# Patient Record
Sex: Male | Born: 1955 | Race: White | Hispanic: Yes | Smoking: Never smoker
Health system: Southern US, Community
[De-identification: ages and names within clinical notes are randomized; demographics above are authoritative.]

## PROBLEM LIST (undated history)

## (undated) DIAGNOSIS — G8929 Other chronic pain: Secondary | ICD-10-CM

## (undated) DIAGNOSIS — N529 Male erectile dysfunction, unspecified: Secondary | ICD-10-CM

## (undated) DIAGNOSIS — R399 Unspecified symptoms and signs involving the genitourinary system: Secondary | ICD-10-CM

## (undated) DIAGNOSIS — E039 Hypothyroidism, unspecified: Secondary | ICD-10-CM

## (undated) DIAGNOSIS — B351 Tinea unguium: Secondary | ICD-10-CM

## (undated) DIAGNOSIS — K219 Gastro-esophageal reflux disease without esophagitis: Secondary | ICD-10-CM

## (undated) DIAGNOSIS — E785 Hyperlipidemia, unspecified: Secondary | ICD-10-CM

## (undated) DIAGNOSIS — M549 Dorsalgia, unspecified: Secondary | ICD-10-CM

## (undated) DIAGNOSIS — M25519 Pain in unspecified shoulder: Secondary | ICD-10-CM

## (undated) DIAGNOSIS — L309 Dermatitis, unspecified: Secondary | ICD-10-CM

## (undated) DIAGNOSIS — M542 Cervicalgia: Secondary | ICD-10-CM

## (undated) DIAGNOSIS — E119 Type 2 diabetes mellitus without complications: Secondary | ICD-10-CM

## (undated) HISTORY — DX: Type 2 diabetes mellitus without complications: E11.9

## (undated) HISTORY — DX: Other chronic pain: G89.29

## (undated) HISTORY — DX: Male erectile dysfunction, unspecified: N52.9

## (undated) HISTORY — DX: Dorsalgia, unspecified: M54.9

## (undated) HISTORY — DX: Gastro-esophageal reflux disease without esophagitis: K21.9

## (undated) HISTORY — DX: Pain in unspecified shoulder: M25.519

## (undated) HISTORY — DX: Unspecified symptoms and signs involving the genitourinary system: R39.9

## (undated) HISTORY — DX: Hypothyroidism, unspecified: E03.9

## (undated) HISTORY — DX: Hyperlipidemia, unspecified: E78.5

## (undated) HISTORY — DX: Tinea unguium: B35.1

## (undated) HISTORY — DX: Dermatitis, unspecified: L30.9

## (undated) HISTORY — DX: Cervicalgia: M54.2

---

## 1997-07-08 DIAGNOSIS — Z5189 Encounter for other specified aftercare: Secondary | ICD-10-CM

## 1997-07-08 HISTORY — PX: CHOLECYSTECTOMY, LAPAROSCOPIC: SHX56

## 1997-07-08 HISTORY — DX: Encounter for other specified aftercare: Z51.89

## 1998-07-08 HISTORY — PX: HERNIA REPAIR: SHX51

## 2003-07-09 HISTORY — PX: THYROID SURGERY: SHX805

## 2005-07-08 HISTORY — PX: SHOULDER SURGERY: SHX246

## 2005-07-08 HISTORY — PX: NECK SURGERY: SHX720

## 2006-10-22 ENCOUNTER — Ambulatory Visit: Payer: Self-pay | Admitting: Family Medicine

## 2006-10-23 ENCOUNTER — Ambulatory Visit: Payer: Self-pay | Admitting: *Deleted

## 2006-12-04 ENCOUNTER — Ambulatory Visit: Payer: Self-pay | Admitting: Internal Medicine

## 2006-12-22 ENCOUNTER — Ambulatory Visit: Payer: Self-pay | Admitting: Family Medicine

## 2007-01-01 ENCOUNTER — Ambulatory Visit: Payer: Self-pay | Admitting: Internal Medicine

## 2007-03-16 ENCOUNTER — Encounter (INDEPENDENT_AMBULATORY_CARE_PROVIDER_SITE_OTHER): Payer: Self-pay | Admitting: Family Medicine

## 2007-03-16 ENCOUNTER — Ambulatory Visit: Payer: Self-pay | Admitting: Internal Medicine

## 2007-03-16 LAB — CONVERTED CEMR LAB
HDL: 43 mg/dL (ref >39)
LDL Cholesterol: 148 mg/dL — ABNORMAL HIGH (ref 0–99)
Total CHOL/HDL Ratio: 5.3
Triglycerides: 189 mg/dL — ABNORMAL HIGH (ref <150)

## 2007-03-30 ENCOUNTER — Ambulatory Visit: Payer: Self-pay | Admitting: Internal Medicine

## 2007-03-30 LAB — CONVERTED CEMR LAB
BUN: 20 mg/dL (ref 6–23)
CO2: 21 meq/L (ref 19–32)
Creatinine, Ser: 0.73 mg/dL (ref 0.40–1.50)
Glucose, Bld: 112 mg/dL — ABNORMAL HIGH (ref 70–99)

## 2007-05-04 ENCOUNTER — Ambulatory Visit: Payer: Self-pay | Admitting: Internal Medicine

## 2007-05-04 LAB — CONVERTED CEMR LAB
Cholesterol: 229 mg/dL — ABNORMAL HIGH (ref 0–200)
HDL: 39 mg/dL — ABNORMAL LOW (ref >39)
Total CHOL/HDL Ratio: 5.9

## 2007-05-20 ENCOUNTER — Ambulatory Visit: Payer: Self-pay | Admitting: Internal Medicine

## 2007-07-20 ENCOUNTER — Ambulatory Visit: Payer: Self-pay | Admitting: Internal Medicine

## 2007-07-20 LAB — CONVERTED CEMR LAB
Albumin: 4.7 g/dL (ref 3.5–5.2)
Alkaline Phosphatase: 109 units/L (ref 39–117)
CO2: 22 meq/L (ref 19–32)
Calcium: 9.3 mg/dL (ref 8.4–10.5)
Creatinine, Ser: 0.75 mg/dL (ref 0.40–1.50)
LDL Cholesterol: 98 mg/dL (ref 0–99)
Potassium: 4.3 meq/L (ref 3.5–5.3)
Total Bilirubin: 0.5 mg/dL (ref 0.3–1.2)
Total Protein: 7.4 g/dL (ref 6.0–8.3)

## 2007-07-27 ENCOUNTER — Ambulatory Visit (HOSPITAL_COMMUNITY): Admission: RE | Admit: 2007-07-27 | Discharge: 2007-07-27 | Payer: Self-pay | Admitting: Family Medicine

## 2007-07-27 ENCOUNTER — Ambulatory Visit: Payer: Self-pay | Admitting: Internal Medicine

## 2007-09-25 ENCOUNTER — Ambulatory Visit: Payer: Self-pay | Admitting: Internal Medicine

## 2007-12-25 ENCOUNTER — Ambulatory Visit: Payer: Self-pay | Admitting: Family Medicine

## 2007-12-25 LAB — CONVERTED CEMR LAB
Free Thyroxine Index: 3.8 (ref 1.0–3.9)
PSA: 0.44 ng/mL (ref 0.10–4.00)
Sed Rate: 2 mm/hr (ref 0–16)
T3 Uptake Ratio: 37.6 % — ABNORMAL HIGH (ref 22.5–37.0)
T4, Total: 10.1 ug/dL (ref 5.0–12.5)
TSH: 0.601 microintl units/mL (ref 0.350–5.50)

## 2008-05-06 ENCOUNTER — Ambulatory Visit: Payer: Self-pay | Admitting: Internal Medicine

## 2008-05-13 ENCOUNTER — Ambulatory Visit: Payer: Self-pay | Admitting: Internal Medicine

## 2008-05-13 ENCOUNTER — Encounter: Payer: Self-pay | Admitting: Family Medicine

## 2008-05-13 LAB — CONVERTED CEMR LAB
ALT: 31 units/L (ref 0–53)
AST: 20 units/L (ref 0–37)
Albumin: 4.4 g/dL (ref 3.5–5.2)
Alkaline Phosphatase: 108 units/L (ref 39–117)
BUN: 17 mg/dL (ref 6–23)
Basophils Absolute: 0 10*3/uL (ref 0.0–0.1)
Calcium: 9.2 mg/dL (ref 8.4–10.5)
Chloride: 104 meq/L (ref 96–112)
Cholesterol: 155 mg/dL (ref 0–200)
Creatinine, Ser: 0.75 mg/dL (ref 0.40–1.50)
Glucose, Bld: 104 mg/dL — ABNORMAL HIGH (ref 70–99)
HCT: 46.3 % (ref 39.0–52.0)
HDL: 40 mg/dL (ref >39)
Hemoglobin: 15.4 g/dL (ref 13.0–17.0)
Lymphs Abs: 1.7 10*3/uL (ref 0.7–4.0)
Potassium: 4.5 meq/L (ref 3.5–5.3)
RBC: 5.14 M/uL (ref 4.22–5.81)
RDW: 13.3 % (ref 11.5–15.5)
TSH: 1.294 microintl units/mL (ref 0.350–4.50)
Total Bilirubin: 0.8 mg/dL (ref 0.3–1.2)
WBC: 5.7 10*3/uL (ref 4.0–10.5)

## 2008-07-22 ENCOUNTER — Ambulatory Visit: Payer: Self-pay | Admitting: Family Medicine

## 2009-03-22 ENCOUNTER — Ambulatory Visit: Payer: Self-pay | Admitting: Internal Medicine

## 2009-03-22 ENCOUNTER — Encounter: Payer: Self-pay | Admitting: Family Medicine

## 2009-03-22 LAB — CONVERTED CEMR LAB
ALT: 41 units/L (ref 0–53)
AST: 26 units/L (ref 0–37)
Albumin: 4.7 g/dL (ref 3.5–5.2)
Alkaline Phosphatase: 112 units/L (ref 39–117)
Basophils Relative: 0 % (ref 0–1)
Calcium: 9.5 mg/dL (ref 8.4–10.5)
Chloride: 106 meq/L (ref 96–112)
Eosinophils Absolute: 0.2 10*3/uL (ref 0.0–0.7)
Eosinophils Relative: 2 % (ref 0–5)
Glucose, Bld: 94 mg/dL (ref 70–99)
MCHC: 35.2 g/dL (ref 30.0–36.0)
Monocytes Relative: 9 % (ref 3–12)
Neutro Abs: 5.8 10*3/uL (ref 1.7–7.7)
Neutrophils Relative %: 62 % (ref 43–77)
Sodium: 139 meq/L (ref 135–145)
TSH: 2.154 microintl units/mL (ref 0.350–4.500)
Total Bilirubin: 0.4 mg/dL (ref 0.3–1.2)
Triglycerides: 161 mg/dL — ABNORMAL HIGH (ref <150)
VLDL: 32 mg/dL (ref 0–40)

## 2009-05-01 ENCOUNTER — Encounter: Payer: Self-pay | Admitting: Family Medicine

## 2009-05-01 ENCOUNTER — Ambulatory Visit: Payer: Self-pay | Admitting: Internal Medicine

## 2009-09-08 ENCOUNTER — Ambulatory Visit: Payer: Self-pay | Admitting: Internal Medicine

## 2009-09-08 ENCOUNTER — Ambulatory Visit (HOSPITAL_COMMUNITY): Admission: RE | Admit: 2009-09-08 | Discharge: 2009-09-08 | Payer: Self-pay | Admitting: Internal Medicine

## 2009-09-08 LAB — CONVERTED CEMR LAB
CO2: 25 meq/L (ref 19–32)
Calcium: 9 mg/dL (ref 8.4–10.5)
Chloride: 103 meq/L (ref 96–112)
Glucose, Bld: 93 mg/dL (ref 70–99)
HDL: 38 mg/dL — ABNORMAL LOW (ref >39)
LDL Cholesterol: 84 mg/dL (ref 0–99)

## 2009-09-15 ENCOUNTER — Ambulatory Visit: Payer: Self-pay | Admitting: Internal Medicine

## 2010-06-15 ENCOUNTER — Encounter (INDEPENDENT_AMBULATORY_CARE_PROVIDER_SITE_OTHER): Payer: Self-pay | Admitting: *Deleted

## 2010-06-15 LAB — CONVERTED CEMR LAB
TSH: 10.17 microintl units/mL — ABNORMAL HIGH (ref 0.350–4.500)
Total CHOL/HDL Ratio: 5
VLDL: 38 mg/dL (ref 0–40)

## 2010-08-06 ENCOUNTER — Encounter (INDEPENDENT_AMBULATORY_CARE_PROVIDER_SITE_OTHER): Payer: Self-pay | Admitting: *Deleted

## 2010-08-06 LAB — CONVERTED CEMR LAB
AST: 23 units/L (ref 0–37)
Albumin: 4.6 g/dL (ref 3.5–5.2)
BUN: 19 mg/dL (ref 6–23)
Basophils Relative: 1 % (ref 0–1)
CO2: 23 meq/L (ref 19–32)
Chloride: 105 meq/L (ref 96–112)
Cholesterol: 194 mg/dL (ref 0–200)
Creatinine, Ser: 0.71 mg/dL (ref 0.40–1.50)
Glucose, Bld: 104 mg/dL — ABNORMAL HIGH (ref 70–99)
HCT: 45.4 % (ref 39.0–52.0)
Lymphocytes Relative: 35 % (ref 12–46)
Lymphs Abs: 2.2 10*3/uL (ref 0.7–4.0)
Monocytes Absolute: 0.6 10*3/uL (ref 0.1–1.0)
Monocytes Relative: 10 % (ref 3–12)
Neutro Abs: 3.4 10*3/uL (ref 1.7–7.7)
Neutrophils Relative %: 53 % (ref 43–77)
Potassium: 4.1 meq/L (ref 3.5–5.3)
RBC: 5.1 M/uL (ref 4.22–5.81)
Sodium: 138 meq/L (ref 135–145)
Total Bilirubin: 0.5 mg/dL (ref 0.3–1.2)

## 2010-09-20 ENCOUNTER — Emergency Department (HOSPITAL_BASED_OUTPATIENT_CLINIC_OR_DEPARTMENT_OTHER): Payer: Worker's Compensation | Attending: Emergency Medicine

## 2010-09-20 ENCOUNTER — Emergency Department (HOSPITAL_BASED_OUTPATIENT_CLINIC_OR_DEPARTMENT_OTHER)
Admission: EM | Admit: 2010-09-20 | Discharge: 2010-09-20 | Disposition: A | Discharge status: Home / Self Care | Payer: Worker's Compensation | Source: Home / Self Care | Attending: Emergency Medicine | Admitting: Emergency Medicine

## 2010-09-20 ENCOUNTER — Emergency Department (INDEPENDENT_AMBULATORY_CARE_PROVIDER_SITE_OTHER): Payer: Worker's Compensation

## 2010-09-20 DIAGNOSIS — W1789XA Other fall from one level to another, initial encounter: Secondary | ICD-10-CM

## 2010-09-20 DIAGNOSIS — M79609 Pain in unspecified limb: Secondary | ICD-10-CM

## 2010-09-20 DIAGNOSIS — M25579 Pain in unspecified ankle and joints of unspecified foot: Secondary | ICD-10-CM

## 2010-09-20 DIAGNOSIS — S93409A Sprain of unspecified ligament of unspecified ankle, initial encounter: Secondary | ICD-10-CM | POA: Insufficient documentation

## 2010-09-24 ENCOUNTER — Other Ambulatory Visit: Payer: Self-pay | Admitting: Family Medicine

## 2010-09-24 ENCOUNTER — Encounter: Payer: Self-pay | Admitting: Family Medicine

## 2010-09-24 ENCOUNTER — Ambulatory Visit (INDEPENDENT_AMBULATORY_CARE_PROVIDER_SITE_OTHER): Payer: Worker's Compensation | Admitting: Family Medicine

## 2010-09-24 ENCOUNTER — Ambulatory Visit (HOSPITAL_BASED_OUTPATIENT_CLINIC_OR_DEPARTMENT_OTHER)
Admission: RE | Admit: 2010-09-24 | Discharge: 2010-09-24 | Disposition: A | Discharge status: Home / Self Care | Payer: Worker's Compensation | Source: Ambulatory Visit | Attending: Family Medicine | Admitting: Family Medicine

## 2010-09-24 DIAGNOSIS — I10 Essential (primary) hypertension: Secondary | ICD-10-CM | POA: Insufficient documentation

## 2010-09-24 DIAGNOSIS — M79609 Pain in unspecified limb: Secondary | ICD-10-CM

## 2010-09-24 DIAGNOSIS — M25579 Pain in unspecified ankle and joints of unspecified foot: Secondary | ICD-10-CM

## 2010-09-24 DIAGNOSIS — E039 Hypothyroidism, unspecified: Secondary | ICD-10-CM | POA: Insufficient documentation

## 2010-09-24 DIAGNOSIS — M25539 Pain in unspecified wrist: Secondary | ICD-10-CM | POA: Insufficient documentation

## 2010-09-24 DIAGNOSIS — M25549 Pain in joints of unspecified hand: Secondary | ICD-10-CM | POA: Insufficient documentation

## 2010-10-04 NOTE — Assessment & Plan Note (Signed)
Summary: LEFT HAND AND RT FOOT PAIN/ACCIDENT/NP/LP   Vital Signs:  Patient profile:   55 year old male Height:      66 inches (167.64 cm) Weight:      190 pounds (86.36 kg) BMI:     30.78 Temp:     98.0 degrees F (36.67 degrees C) oral Pulse rate:   76 / minute BP sitting:   146 / 90  (right arm)  Vitals Entered By: Baxter Hire) (September 24, 2010 11:07 AM) CC: L Hand and R foot pain due to accident Nutritional Status BMI of > 30 = obese  Does patient need assistance? Functional Status Self care Ambulation Normal   CC:  L Hand and R foot pain due to accident.  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 Unsure if he rolled his ankle when this happened. + Swelling, bruising, unable to walk following injury to right leg. No prior right leg fractures or severe sprains. He went to ED following injury and had x-rays of tib-fib, ankle and foot which were all negative. His wrist did not start hurting him until later in the day and the next day. He bought an over the counter wrist brace which helps but noted he is wearing a right splint on his left wrist. No swelling or bruising of wrist - pain is centered about distal wrist, 1st and 2nd metacarpals. Taking hydrocodone and icing which have helped both conditions Was also given an ASO which he has been wearing on right ankle and using crutches Has been out of work since injury.  Habits & Providers  Alcohol-Tobacco-Diet     Alcohol drinks/day: 0     Tobacco Status: never  Current Medications (verified): 1)  Levothroid 100 Mcg Tabs (Levothyroxine Sodium)  Allergies (verified): 1)  !  Penicillin  Past History:  Past Surgical History: Left shoulder surgery - ? rotator cuff - reports  ~20 pins/screws in shoulder  Family History: + HTN parents negative heart disease, DM  Social History: negative tobacco and alcohol use Works for [employer] Status:  never  Physical Exam  General:  Well-developed,well-nourished,in no acute distress; alert,appropriate and cooperative throughout examination Msk:  L wrist: No gross deformity, swelling, or bruising. Mild TTP 1st and 2nd MC.  No snuffbox tenderness.  Mild tenderness at radioulnar joint as well Lacks 15 degrees flexion and extension of wrist. No pain with ulnar deviation. Finger abduction, thumb opposition strength 5/5.  FROM fingers NVI distally.  R lower leg: Mod swelling and some bruising throughout lower leg (bruising mostly centered about medial heel. TTP diffusely about achilles, post/med malleoli, ATFL, ankle joint.  No base 5th or navicular tenderness or fibular head tenderness of foot/lower leg. Very limited ROM of ankle. Did not assess strength. Achilles feels intact - confirmed with brief u/s evaluation.  Negative thompsons test 1+ ant drawer and talar tilt - painful. < 2 sec cap refill, 2+ dp pulses, sensation intact   Impression & Recommendations:  Problem # 1:  ANKLE PAIN, RIGHT (ICD-719.47) Assessment New 2/2 ankle sprain and severe contusions from his fall.  Continue ASO.  Ice area  15 minutes at a time 3-4 times a day, crutches, elevation.  Aleve twice a day to help with pain, swelling as well.  Bear weight when tolerated.  Simple ROM/alphabet exercises.  F/u in 2 weeks for recheck.  Out of work in the meantime.  Problem # 2:  WRIST PAIN, LEFT (ZOX-096.04) Assessment: New X-rays negative for acute fracture to wrist or hand.  No tenderness at snuffbox so no concern for scaphoid fracture that would require thumb spica.  Provided with wrist brace for left side today.  Icing, elevation, aleve will  help with this as well.  In 2 weeks anticipate starting exercises.  Will consider PT for this and ankle.  Orders: Diagnostic X-Ray/Fluoroscopy (Diagnostic X-Ray/Flu) Brace Wrist ([id number])  Complete Medication List: 1)  Levothroid 100 Mcg Tabs (Levothyroxine sodium)  Patient Instructions: 1)  Ice 15 minutes at a time 3-4 times a day 2)  Take aleve 2 tablets twice a day with food for pain and inflammation. 3)  Elevate above the level of your heart when possible 4)  Crutches if needed to help with walking 5)  Bear weight when tolerated 6)  Up/down and alphabet exercise 2-3 sets once a day. 7)  For wrist, wear wrist brace. 8)  Icing, aleve will help with your wrist as well.  9)  Out of work in the meantime 10)  Follow up with me in 2 weeks for a recheck on your status. 11)  We will consider physical therapy in the future.   Orders Added: 1)  Diagnostic X-Ray/Fluoroscopy [Diagnostic X-Ray/Flu] 2)  Diagnostic X-Ray/Fluoroscopy [Diagnostic X-Ray/Flu] 3)  New Patient Level III [11914] 4)  Brace Wrist [[id number]]

## 2010-10-04 NOTE — Letter (Signed)
Summary: Generic Letter  [facility]  [address]   [place], [place] [address]   Phone: [phone]  Fax:     09/24/2010  Nicholas Barton [address] [place], [place]  [address]  [place]  To Whom It May Concern:  Nicholas Barton will be out of work for at least the next two weeks.  His injuries may require up to 6 weeks to completely recover from.  We will reassess his status at an appointment in 2 weeks.   Sincerely,   Norton Blizzard MD

## 2010-10-08 ENCOUNTER — Ambulatory Visit (INDEPENDENT_AMBULATORY_CARE_PROVIDER_SITE_OTHER): Payer: Worker's Compensation | Admitting: Family Medicine

## 2010-10-08 ENCOUNTER — Encounter: Payer: Self-pay | Admitting: Family Medicine

## 2010-10-08 DIAGNOSIS — M25539 Pain in unspecified wrist: Secondary | ICD-10-CM

## 2010-10-08 DIAGNOSIS — M79604 Pain in right leg: Secondary | ICD-10-CM

## 2010-10-08 DIAGNOSIS — M79609 Pain in unspecified limb: Secondary | ICD-10-CM

## 2010-10-08 DIAGNOSIS — M25579 Pain in unspecified ankle and joints of unspecified foot: Secondary | ICD-10-CM

## 2010-10-08 NOTE — Assessment & Plan Note (Signed)
Concern given now 2 1/2 weeks out, still having difficulty bearing weight with persistent swelling and pain distal tibia.  X-rays negative for fracture - will proceed with MRI of tibia/fibula to further assess for bony and soft tissue injury.  Would expect improvement since visit 2 weeks ago.  Continue icing, aleve, crutches, ASO in meantime.

## 2010-10-08 NOTE — Patient Instructions (Signed)
Given that you are not improving and the x-rays were negative, we will go ahead with MRIs to further assess for fracture or ligamentous injury that may have been missed or not seen on the x-rays. Continue with the braces for your wrist and ankle. Crutches to help with walking. Ice as needed We will contact you with the date and time of the studies - I will call you 1-2 days after these to tell you how we are to proceed.

## 2010-10-08 NOTE — Progress Notes (Signed)
Subjective:    Patient ID: Nicholas Barton, male    DOB: 03-Jul-1956, 55 y.o.   MRN: [id number]  HPI 55 yo M here for 2 1/2 week f/u left wrist and lower leg pain s/p accident at work.  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  Unsure if he rolled his ankle when this happened.  + Swelling, bruising, unable to walk following injury to right leg.  No prior right leg fractures or severe sprains.  He went to ED following injury and had x-rays of tib-fib, ankle and foot which were all negative.  His wrist did not start hurting him until later in the day and the next day.  We x-rayed his left wrist and hand - no fractures. No swelling or bruising of wrist - pain is centered about distal wrist, 1st and 2nd metacarpals.  Has been using crutches, brace for left wrist, aso for right ankle. Has been out of work since injury.  States pain in right lower leg still 7/10, left wrist 8/10. Still walking with a limp.  Past Medical History  Diagnosis Date  . Hypothyroidism     No current outpatient prescriptions on file prior to visit.    No past surgical history on file.  Allergies  Allergen Reactions  . Penicillins     History   Social History  . Marital Status: Married    Spouse Name: N/A    Number of Children: N/A  . Years of Education: N/A   Occupational History  . Not on file.   Social History Main Topics  . Smoking status: Never Smoker   . Smokeless tobacco: Not on file  . Alcohol Use: Not on file  . Drug Use: Not on file  . Sexually Active: Not on file   Other Topics Concern  . Not on file   Social History Narrative  . No narrative on file    Family History  Problem Relation Age of Onset  . Hypertension Mother   . Hypertension Father   . Heart attack  Neg Hx   . Diabetes Neg Hx     BP 143/80  Pulse 82  Temp(Src) 98 F (36.7 C) (Oral)  Ht 5\' 7"  (1.702 m)  Wt 193 lb (87.544 kg)  BMI 30.23 kg/m2  Review of Systems See HPI above.    Objective:   Physical Exam General: Well-developed,well-nourished,in no acute distress; alert,appropriate and cooperative throughout examination   Msk: L wrist:  No gross deformity, swelling, or bruising.  Mild TTP base 1st and 2nd MC. No snuffbox tenderness. Mod TTP distal radius Lacks 10 degrees flexion and extension of wrist.  Finger abduction, thumb opposition strength 5/5. FROM fingers  NVI distally.   R lower leg:  Mod swelling and some bruising throughout lower leg especially just above where ASO was on tibia. TTP distal 1/3rd tibia including med malleolus greatest, mild ttp Achilles.  No base 5th or navicular tenderness or fibular head tenderness of foot/lower leg.  Mod limitation ROM of ankle 2/2 pain. Did not assess strength.  Achilles intact. Negative thompsons test  < 2 sec cap refill, 2+ dp pulses, sensation intact     Assessment & Plan:  1. R ankle pain - concern given now  2 1/2 weeks out, still having difficulty bearing weight with persistent swelling and pain distal tibia.  X-rays negative for fracture - will proceed with MRI of tibia/fibula to further assess for bony and soft tissue injury.  Would expect improvement since visit 2 weeks ago.  Continue icing, aleve, crutches, ASO in meantime.  2. L wrist pain - also not improved with severe pain mostly at distal radius, base 1st MC though not at snuffbox.  Proceed with MRI of L wrist to further assess given x-rays were negative.  Continue wrist brace, icing, aleve.  Will remain out of work in meantime.

## 2010-10-08 NOTE — Assessment & Plan Note (Signed)
Also not improved with severe pain mostly at distal radius, base 1st MC though not at snuffbox.  Proceed with MRI of L wrist to further assess given x-rays were negative.  Continue wrist brace, icing, aleve.  Will remain out of work in meantime.

## 2010-10-26 ENCOUNTER — Ambulatory Visit: Payer: Self-pay | Admitting: Family Medicine

## 2010-10-26 ENCOUNTER — Other Ambulatory Visit: Payer: Self-pay | Admitting: Family Medicine

## 2010-10-26 DIAGNOSIS — M25532 Pain in left wrist: Secondary | ICD-10-CM

## 2010-10-26 DIAGNOSIS — M25539 Pain in unspecified wrist: Secondary | ICD-10-CM

## 2010-10-26 MED ORDER — OXYCODONE-ACETAMINOPHEN 7.5-325 MG PO TABS: 1.0000 | ORAL_TABLET | ORAL | Status: AC | PRN

## 2010-10-26 NOTE — Assessment & Plan Note (Signed)
Patient called and patient's representative called as well.  Requesting pain medication which I feel is reasonable while we wait for MRI results.  Also discussed there was some confusion on coverage - we were informed a couple days ago that workers comp does not have [place] in their plan and may send him to a different physician.  Had not heard back regarding this.  They were also going to set up the MRIs for the patient instead of [place] scheduling them.  His representative will also look into these issues but is under the impression that [facility] has approved for continued care here.  I would like to see him back following the MRIs to go over them and how next to proceed.

## 2010-11-05 ENCOUNTER — Ambulatory Visit (INDEPENDENT_AMBULATORY_CARE_PROVIDER_SITE_OTHER): Payer: Worker's Compensation | Admitting: Family Medicine

## 2010-11-05 ENCOUNTER — Encounter: Payer: Self-pay | Admitting: Family Medicine

## 2010-11-05 VITALS — BP 144/90 | HR 76 | Temp 97.5°F | Ht 67.0 in | Wt 192.0 lb

## 2010-11-05 DIAGNOSIS — M25539 Pain in unspecified wrist: Secondary | ICD-10-CM

## 2010-11-05 DIAGNOSIS — M549 Dorsalgia, unspecified: Secondary | ICD-10-CM

## 2010-11-05 DIAGNOSIS — M25579 Pain in unspecified ankle and joints of unspecified foot: Secondary | ICD-10-CM

## 2010-11-05 DIAGNOSIS — M25532 Pain in left wrist: Secondary | ICD-10-CM

## 2010-11-05 DIAGNOSIS — M546 Pain in thoracic spine: Secondary | ICD-10-CM

## 2010-11-05 MED ORDER — CYCLOBENZAPRINE HCL 5 MG PO TABS: 5.0000 mg | ORAL_TABLET | Freq: Three times a day (TID) | ORAL | Status: DC | PRN

## 2010-11-05 NOTE — Patient Instructions (Signed)
You have a right calf and achilles strain (with partial tearing of achilles) - this should respond well to physical therapy. Start physical therapy for your calf and achilles and do home exercises they show you. Read handout on upper back and do home stretches and exercises Heat 15 minutes at time for upper back 3-4 times a day. We will refer you to a hand specialist to review your MRI - it's likely they will also put you in physical therapy for this but we would like their opinion on your MRI. Follow up with me in 3 weeks for a recheck on your progress.

## 2010-11-05 NOTE — Assessment & Plan Note (Signed)
MRI reviewed - patient with severe achilles strain and calf strain.  Ambulating better.  Start PT to increase range of motion, strength.  Continue icing, aleve.

## 2010-11-05 NOTE — Assessment & Plan Note (Signed)
MRI with extensor tendinitis as most likely cause of patient's pain which would respond well to physical therapy.  However, concern for edema in lunate that may represent early avascular necrosis.  This bone does have a tenuous blood supply and this must be reviewed and evaluated by hand surgeon for their recommendations to ensure no surgical intervention necessary.  He will f/u here in 3 weeks for ankle s/p PT and will review hand surgeon notes regarding wrist - hopefully will be able to return to work following next visit in some capacity.

## 2010-11-05 NOTE — Progress Notes (Signed)
Subjective:    Patient ID: Nicholas Barton, male    DOB: 1955-10-01, 55 y.o.   MRN: [id number]  HPI  55 yo M here for 4 week f/u left wrist and lower leg pain s/p accident at work.  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  Unsure if he rolled his ankle when this happened.  + Swelling, bruising, unable to walk following injury to right leg.  No prior right leg fractures or severe sprains.  He went to ED following injury and had x-rays of tib-fib, ankle and foot which were all negative.  His wrist did not start hurting him until later in the day and the next day.  We x-rayed his left wrist and hand - no fractures. At follow-up 4 weeks ago was not improving, MRIs were ordered but not done until the past week of left wrist and right tib-fib. States right calf has improved - still with mild limp but improved.  Swelling and bruising resolved. Wrist still with pain mostly at base of 1st and 2nd metacarpals, radial side of wrist. Still wearing left wrist brace.  Done with using ASO right ankle but still has at home. Has been out of work since injury.   Past Medical History  Diagnosis Date  . Hypothyroidism     Current Outpatient Prescriptions on File Prior to Visit  Medication Sig Dispense Refill  . oxyCODONE-acetaminophen (PERCOCET) 7.5-325 MG per tablet Take 1 tablet by mouth every 4 (four) hours as needed for pain.  40 tablet  0    No past surgical history on file.  Allergies  Allergen Reactions  . Penicillins     History   Social History  . Marital Status: Married    Spouse Name: N/A    Number of Children: N/A  . Years of Education: N/A   Occupational History  . Not on file.   Social History Main Topics  . Smoking status: Never Smoker   . Smokeless  tobacco: Not on file  . Alcohol Use: Not on file  . Drug Use: Not on file  . Sexually Active: Not on file   Other Topics Concern  . Not on file   Social History Narrative  . No narrative on file    Family History  Problem Relation Age of Onset  . Hypertension Mother   . Hypertension Father   . Heart attack Neg Hx   . Diabetes Neg Hx     BP 144/90  Pulse 76  Temp(Src) 97.5 F (36.4 C) (Oral)  Ht 5\' 7"  (1.702 m)  Wt 192 lb (87.091 kg)  BMI 30.07 kg/m2  Review of Systems  See HPI above.    Objective:   Physical Exam  General: Well-developed,well-nourished,in no acute distress; alert,appropriate and cooperative throughout examination   Msk: L wrist:  No gross deformity, swelling, or bruising.  Mild TTP base 1st and 2nd MC. No snuffbox tenderness. TTP within finger extensor tendons as cross dorsal hand.  Mild TTP over lunate. Lacks 10 degrees flexion and extension of wrist.  Finger abduction, thumb opposition strength 5/5.  FROM fingers  NVI distally.   R lower leg:  Mild swelling mid-portion of medial right calf.  Tenderness in this area.  No other  swelling.  Bruising resolved. No bony TTP - improved. Mild limitation ROM of ankle 2/2 pain. Did not assess strength.  Achilles intact. Negative thompsons test  Able to do calf raise < 2 sec cap refill, 2+ dp pulses, sensation intact   MSK u/s done to reassess right achilles - no full thickness tears noted on transverse and longitudinal views.    Assessment & Plan:  1. R ankle pain - MRI reviewed - patient with severe achilles strain and calf strain.  Ambulating better.  Start PT to increase range of motion, strength.  Continue icing, aleve.  2. L wrist pain - MRI with extensor tendinitis as most likely cause of patient's pain which would respond well to physical therapy.  However, concern for edema in lunate that may represent early avascular necrosis.  This bone does have a tenuous blood supply and this must be  reviewed and evaluated by hand surgeon for their recommendations to ensure no surgical intervention necessary.  He will f/u here in 3 weeks for ankle s/p PT and will review hand surgeon notes regarding wrist - hopefully will be able to return to work following next visit in some capacity.

## 2010-11-13 ENCOUNTER — Ambulatory Visit: Payer: Worker's Compensation | Attending: Family Medicine | Admitting: Physical Therapy

## 2010-11-13 DIAGNOSIS — M25673 Stiffness of unspecified ankle, not elsewhere classified: Secondary | ICD-10-CM | POA: Insufficient documentation

## 2010-11-13 DIAGNOSIS — M25539 Pain in unspecified wrist: Secondary | ICD-10-CM | POA: Insufficient documentation

## 2010-11-13 DIAGNOSIS — IMO0001 Reserved for inherently not codable concepts without codable children: Secondary | ICD-10-CM | POA: Insufficient documentation

## 2010-11-13 DIAGNOSIS — M25676 Stiffness of unspecified foot, not elsewhere classified: Secondary | ICD-10-CM | POA: Insufficient documentation

## 2010-11-13 DIAGNOSIS — M25579 Pain in unspecified ankle and joints of unspecified foot: Secondary | ICD-10-CM | POA: Insufficient documentation

## 2010-11-14 ENCOUNTER — Ambulatory Visit: Payer: Worker's Compensation | Admitting: Rehabilitation

## 2010-11-16 ENCOUNTER — Ambulatory Visit: Payer: Worker's Compensation | Admitting: Rehabilitation

## 2010-11-19 ENCOUNTER — Ambulatory Visit: Payer: Worker's Compensation | Admitting: Rehabilitation

## 2010-11-21 ENCOUNTER — Ambulatory Visit: Payer: Worker's Compensation | Admitting: Rehabilitation

## 2010-11-23 ENCOUNTER — Ambulatory Visit: Payer: Worker's Compensation | Admitting: Rehabilitation

## 2010-11-26 ENCOUNTER — Ambulatory Visit (INDEPENDENT_AMBULATORY_CARE_PROVIDER_SITE_OTHER): Payer: Worker's Compensation | Admitting: Family Medicine

## 2010-11-26 ENCOUNTER — Ambulatory Visit: Payer: Worker's Compensation | Admitting: Rehabilitation

## 2010-11-26 ENCOUNTER — Ambulatory Visit (HOSPITAL_BASED_OUTPATIENT_CLINIC_OR_DEPARTMENT_OTHER)
Admission: RE | Admit: 2010-11-26 | Discharge: 2010-11-26 | Disposition: A | Discharge status: Home / Self Care | Payer: Worker's Compensation | Source: Ambulatory Visit | Attending: Family Medicine | Admitting: Family Medicine

## 2010-11-26 ENCOUNTER — Encounter: Payer: Self-pay | Admitting: Family Medicine

## 2010-11-26 VITALS — BP 133/86 | HR 84 | Temp 97.9°F | Ht 66.0 in | Wt 193.0 lb

## 2010-11-26 DIAGNOSIS — M79642 Pain in left hand: Secondary | ICD-10-CM

## 2010-11-26 DIAGNOSIS — M7660 Achilles tendinitis, unspecified leg: Secondary | ICD-10-CM

## 2010-11-26 DIAGNOSIS — M25549 Pain in joints of unspecified hand: Secondary | ICD-10-CM | POA: Insufficient documentation

## 2010-11-26 DIAGNOSIS — M79609 Pain in unspecified limb: Secondary | ICD-10-CM

## 2010-11-26 DIAGNOSIS — M25579 Pain in unspecified ankle and joints of unspecified foot: Secondary | ICD-10-CM

## 2010-11-26 DIAGNOSIS — R609 Edema, unspecified: Secondary | ICD-10-CM | POA: Insufficient documentation

## 2010-11-26 MED ORDER — NITROGLYCERIN 0.2 MG/HR TD PT24: MEDICATED_PATCH | TRANSDERMAL | Status: DC

## 2010-11-26 MED ORDER — MELOXICAM 15 MG PO TABS: 15.0000 mg | ORAL_TABLET | Freq: Every day | ORAL | Status: DC

## 2010-11-26 NOTE — Assessment & Plan Note (Signed)
2/2 severe achilles strain and calf strain.  Continues to improve.  Will continue with PT but will add nitro patches.  Continue meloxicam, icing.  Interpreter used to give instructions and answer questions.

## 2010-11-26 NOTE — Progress Notes (Signed)
Subjective:    Patient ID: Nicholas Barton, male    DOB: [birth year], 55 y.o.   MRN: [id number]  HPI  55 yo M here for 3 week f/u left wrist and lower leg pain s/p accident at work.  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  Unsure if he rolled his ankle when this happened.  + Swelling, bruising, unable to walk following injury to right leg.  No prior right leg fractures or severe sprains.  He went to ED following injury and had x-rays of tib-fib, ankle and foot which were all negative.  His wrist did not start hurting him until later in the day and the next day.  We x-rayed his left wrist and hand - no fractures. At follow-up 7 weeks ago was not improving, MRIs were ordered but not done until the 3-4 weeks ago of left wrist and right tib-fib. No complete tear of achilles seen on MRI which fit with his exam. 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 Started physical therapy for right calf/ankle (severe calf/achilles strain) and this has improved - able to walk without limp currently.  No longer with swelling or bruising. Wrist still with pain only at base of 1st metacarpals. Still wearing left wrist brace.  Done with using ASO right ankle but still has at home. Has been out of work since injury.   Past Medical History  Diagnosis Date  . Hypothyroidism     Current Outpatient Prescriptions on File Prior to Visit  Medication Sig Dispense Refill  . DISCONTD: cyclobenzaprine (FLEXERIL) 5 MG tablet Take 1 tablet (5 mg total) by mouth every 8 (eight) hours as needed for muscle spasms.  45 tablet  0    No past surgical history on file.  Allergies  Allergen Reactions  .  Penicillins     History   Social History  . Marital Status: Married    Spouse Name: N/A    Number of Children: N/A  . Years of Education: N/A   Occupational History  . Not on file.   Social History Main Topics  . Smoking status: Never Smoker   . Smokeless tobacco: Not on file  . Alcohol Use: Not on file  . Drug Use: Not on file  . Sexually Active: Not on file   Other Topics Concern  . Not on file   Social History Narrative  . No narrative on file    Family History  Problem Relation Age of Onset  . Hypertension Mother   . Hypertension Father   . Heart attack Neg Hx   . Diabetes Neg Hx     BP 133/86  Pulse 84  Temp(Src) 97.9 F (36.6 C) (Oral)  Ht 5\' 6"  (1.676 m)  Wt 193 lb (87.544 kg)  BMI 31.15 kg/m2  Review of Systems  See HPI above.    Objective:   Physical Exam  General: Well-developed,well-nourished,in no acute distress; alert,appropriate and cooperative throughout examination   Msk: L wrist:  No gross deformity, swelling, or bruising.  Mild TTP 1st CMC joint. No snuffbox tenderness. No longer with TTP within finger extensor tendons as cross  dorsal hand.  NO TTP over lunate. Full flexion but lacks 5-10 degrees extension of wrist.  Finger abduction, thumb opposition strength 5/5.  FROM fingers  NVI distally.   R lower leg:  No swelling, bruising, deformity.  Much improved. Mild TTP 2 cm proximal to calcaneus within achilles.  No other TTP within calf, achilles. No bony TTP. FROM ankle. Strength 4/5 with plantarflexion, 5/5 all other motions. Achilles intact. Negative thompsons test  Able to do calf raise < 2 sec cap refill, 2+ dp pulses, sensation intact      Assessment & Plan:  1. R ankle pain - 2/2 severe achilles strain and calf strain.  Continues to improve.  Will continue with PT but will add nitro patches.  Continue meloxicam, icing.  Interpreter used to give instructions and answer questions.  2. L wrist/hand pain - He no longer has  tenderness on lunate - x-rays repeated to assess for evidence of AVN of lunate which is negative.  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  His hand surgeon appointment was to review MRI given he had some mild tenderness at his lunate which has now completely resolved.  We will cancel this appointment.  Start return to work protocol.  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  Then will reassess in 2 weeks, hopefully advance to full days and possibly to his normal work activities depending on his progress.

## 2010-11-26 NOTE — Patient Instructions (Addendum)
Use heel lifts in your right shoe to help unload area. Continue with physical therapy for this but add on therapy for left hand. Meloxicam daily for pain. Ice bucket 10-15 minutes at end of day - can ice 3-4 times a day. Avoid uneven ground, hills as much as possible. Will add nitro patches since pain continues in your achilles. We will cancel your hand surgeon appointment - the pain in this location has gone away and your repeat x-rays do not show signs of AVN (what we were worried about).

## 2010-11-26 NOTE — Assessment & Plan Note (Signed)
He no longer has tenderness on lunate - x-rays repeated to assess for evidence of AVN of lunate which is negative.  At this point he is much improved and pain now is localized to 1st [facility] joint where he has mild arthritis.  His hand surgeon appointment was to review MRI given he had some mild tenderness at his lunate which has now completely resolved.  We will cancel this appointment.  Start return to work protocol.  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  Then will reassess in 2 weeks, hopefully advance to full days and possibly to his normal work activities depending on his progress.

## 2010-11-29 ENCOUNTER — Ambulatory Visit: Payer: Worker's Compensation | Admitting: Rehabilitation

## 2010-12-04 ENCOUNTER — Ambulatory Visit: Payer: Worker's Compensation | Admitting: Physical Therapy

## 2010-12-05 ENCOUNTER — Ambulatory Visit: Payer: Worker's Compensation | Admitting: Rehabilitation

## 2010-12-06 ENCOUNTER — Ambulatory Visit: Payer: Worker's Compensation | Admitting: Rehabilitation

## 2010-12-10 ENCOUNTER — Encounter: Payer: Self-pay | Admitting: Family Medicine

## 2010-12-10 ENCOUNTER — Ambulatory Visit: Payer: Worker's Compensation | Attending: Family Medicine | Admitting: Physical Therapy

## 2010-12-10 ENCOUNTER — Ambulatory Visit (INDEPENDENT_AMBULATORY_CARE_PROVIDER_SITE_OTHER): Payer: Worker's Compensation | Admitting: Family Medicine

## 2010-12-10 DIAGNOSIS — IMO0001 Reserved for inherently not codable concepts without codable children: Secondary | ICD-10-CM | POA: Insufficient documentation

## 2010-12-10 DIAGNOSIS — M25539 Pain in unspecified wrist: Secondary | ICD-10-CM | POA: Insufficient documentation

## 2010-12-10 DIAGNOSIS — M25579 Pain in unspecified ankle and joints of unspecified foot: Secondary | ICD-10-CM | POA: Insufficient documentation

## 2010-12-10 DIAGNOSIS — M25676 Stiffness of unspecified foot, not elsewhere classified: Secondary | ICD-10-CM | POA: Insufficient documentation

## 2010-12-10 DIAGNOSIS — M25673 Stiffness of unspecified ankle, not elsewhere classified: Secondary | ICD-10-CM | POA: Insufficient documentation

## 2010-12-11 ENCOUNTER — Encounter: Payer: Self-pay | Admitting: Family Medicine

## 2010-12-11 NOTE — Assessment & Plan Note (Signed)
L wrist/hand pain - Patient now has pain in a wider area than the localized 1st [facility] area that he had at last visit. 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 May have worse pain because he started PT for this but his exam is inconsistent as noted above. Return back to work without restrictions but 4 hour days for next 2 weeks then 6 hour days for 2 weeks - then will see him back in the office.

## 2010-12-11 NOTE — Progress Notes (Signed)
Subjective:    Patient ID: Nicholas Barton, male    DOB: Dec 06, 1955, 55 y.o.   MRN: [id number]  HPI  55 yo M here for 2 week f/u left wrist and lower leg pain s/p accident at work.  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  Unsure if he rolled his ankle when this happened.  + Swelling, bruising, unable to walk following injury to right leg.  No prior right leg fractures or severe sprains.  He went to ED following injury and had x-rays of tib-fib, ankle and foot which were all negative.  His wrist did not start hurting him until later in the day and the next day.  We x-rayed his left wrist and hand - no fractures. At follow-up 9-10 weeks ago was not improving, MRIs were ordered but not done until the 6 weeks ago of left wrist and right tib-fib. No complete tear of achilles seen on MRI which fit with his exam and msk ultrasound. 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 Has been in physical therapy for calf/achilles and since last appointment added PT for his left wrist. Wrote for him to return to light duty at last OV 2 weeks ago but states he has not gone back to work, citing pain in wrist and achilles. He reports pain in right achilles and calf is still a 6/10 and left wrist is 4/10. Unable to afford nitro patches so did not get these. Still taking mobic. No longer using wrist or ankle braces. Watched him walking briskly in from car last week and walks normally with good dorsiflexion of ankle without a limp.    Past Medical History  Diagnosis Date  . Hypothyroidism     Current Outpatient Prescriptions on File Prior to Visit    Medication Sig Dispense Refill  . meloxicam (MOBIC) 15 MG tablet Take 1 tablet (15 mg total) by mouth daily. With food.  30 tablet  1  . DISCONTD: nitroGLYCERIN (NITRODUR - DOSED IN MG/24 HR) 0.2 mg/hr Place 1/4 patch onto skin daily - change every morning.  Please provide spanish instructions.  30 patch  11    No past surgical history on file.  Allergies  Allergen Reactions  . Penicillins     History   Social History  . Marital Status: Married    Spouse Name: N/A    Number of Children: N/A  . Years of Education: N/A   Occupational History  . Not on file.   Social History Main Topics  . Smoking status: Never Smoker   . Smokeless tobacco: Not on file  . Alcohol Use: Not on file  . Drug Use: Not on file  . Sexually Active: Not on file   Other Topics Concern  . Not on file   Social History Narrative  . No narrative on file    Family History  Problem Relation Age of Onset  . Hypertension Mother   . Hypertension Father   . Heart attack Neg Hx   . Diabetes Neg Hx     BP 136/85  Pulse 83  Temp(Src)  98 F (36.7 C) (Oral)  Ht 5\' 7"  (1.702 m)  Wt 190 lb (86.183 kg)  BMI 29.76 kg/m2  Review of Systems  See HPI above.    Objective:   Physical Exam  General: Well-developed,well-nourished,in no acute distress; alert,appropriate and cooperative throughout examination   Msk: L wrist:  No gross deformity, swelling, or bruising.  TTP diffusely 1st CMC joint, dorsal wrist, radial aspect of entire forearm but inconsistent on exam - sometimes severe pain when pressing these areas and withdraws, other times when distracted and palpating does not withdraw or wince in pain as he does when not distracted. No snuffbox tenderness.  FROM with 5/5 strength - states hurts with flexion and extension in wrist and base of thumb. Finger abduction, thumb opposition strength 5/5.  FROM fingers  NVI distally.   R lower leg:  No swelling, bruising, deformity.  Much improved. TTP  throughout medial calf, tibia, achilles - worst at musculotendinous junction. Similar exam to that of wrist - inconsistent, winces and withdraws but not so when distracted - one consistent area of pain is at musculotendinous junction medially. FROM ankle. Able to do calf raise but on resisted testing strength is 3/5 with plantarflexion, 5/5 other motions. Achilles intact. Negative thompsons test  < 2 sec cap refill, 2+ dp pulses, sensation intact   MSK u/s: R achilles and calf - no neovascularity.  Achilles tendon intact on transverse and long views - able to follow proximally through distal insertion.    Assessment & Plan:  1. R ankle pain - 2/2 severe achilles strain and calf strain.  Based on his exam today and on speaking with physical therapists involved in his care and on watching him outside of the office, I believe his pain is milder than he suggests.  On distracting him, palpating same areas of ankle and wrist do not elicit much pain, wincing, or withdrawal.  Last week he walked briskly into clinic without a limp with excellent dorsiflexion and plantarflexion of his right ankle.  Ultrasound and examination significantly improved objectively.  Feel he can return to work without restrictions - expect him to have some pain but will start with limited hours and ease him back into regular work schedule.  Continue PT, mobic, icing.  Advised him to not rush at work and to be careful (initial injury was from a height when ladder/stairs were not in right location).  Used interpreter to provide him these instructions.  2. L wrist/hand pain - Patient now has pain in a wider area than the localized [facility] area that he had at last visit.  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  May have worse pain because he started PT for this but his exam is inconsistent as noted above.  Return back to work without restrictions but 4 hour days for next 2 weeks then 6 hour days  for 2 weeks - then will see him back in the office.  Note: On discussion with patient about instructions he asked me if he could go back to work next Monday instead because of doctor's appointments and PT he has this week - advised there is no medical reason to continue holding him out of work for this time period.  He also noted that work is not that busy currently so there's no rush in going back - advised him this would be beneficial as having less work would allow him to take his time.

## 2010-12-11 NOTE — Assessment & Plan Note (Signed)
2/2 severe achilles strain and calf strain. Based on his exam today and on speaking with physical therapists involved in his care and on watching him outside of the office, I believe his pain is milder than he suggests. On distracting him, palpating same areas of ankle and wrist do not elicit much pain, wincing, or withdrawal. Last week he walked briskly into clinic without a limp with excellent dorsiflexion and plantarflexion of his right ankle. Ultrasound and examination significantly improved objectively. Feel he can return to work without restrictions - expect him to have some pain but will start with limited hours and ease him back into regular work schedule. Continue PT, mobic, icing. Advised him to not rush at work and to be careful (initial injury was from a height when ladder/stairs were not in right location). Used interpreter to provide him these instructions.

## 2010-12-13 ENCOUNTER — Ambulatory Visit: Payer: Worker's Compensation | Admitting: Rehabilitation

## 2010-12-17 ENCOUNTER — Ambulatory Visit: Payer: Worker's Compensation | Admitting: Rehabilitation

## 2010-12-20 ENCOUNTER — Encounter: Payer: Worker's Compensation | Admitting: Rehabilitation

## 2010-12-20 ENCOUNTER — Encounter: Payer: Self-pay | Admitting: Family Medicine

## 2010-12-20 ENCOUNTER — Ambulatory Visit (INDEPENDENT_AMBULATORY_CARE_PROVIDER_SITE_OTHER): Payer: Self-pay | Admitting: Family Medicine

## 2010-12-20 DIAGNOSIS — M79604 Pain in right leg: Secondary | ICD-10-CM | POA: Insufficient documentation

## 2010-12-20 DIAGNOSIS — M25532 Pain in left wrist: Secondary | ICD-10-CM | POA: Insufficient documentation

## 2010-12-20 DIAGNOSIS — M79609 Pain in unspecified limb: Secondary | ICD-10-CM

## 2010-12-20 DIAGNOSIS — M25539 Pain in unspecified wrist: Secondary | ICD-10-CM

## 2010-12-20 NOTE — Assessment & Plan Note (Signed)
2/2 severe achilles strain and calf strain from fall.  At this point patient's symptoms are most consistent with residual tendinopathy but parts of his exam suggest non-anatomic pain as well.  I do believe his pain worsens at work with prolonged walking, consistent with residual tendinopathy and strain that he suffered.  However, his diffuse tenderness and wincing (not at injured location), withdrawal on palpation of areas that were not injured suggests exaggeration of symptoms and a possible factitious component.  Physical therapists involved in his care have noted this exaggeration as well.  His strength testing of ankle would suggest he cannot do a toe raise but he is able to do so.  Have watched him walk briskly into clinic then start limping in our office.  On distraction he does not have as much pain in palpated areas, withdraw, or wince.  When I placed him on light duty 4 weeks ago he did not return to work, citing the pain - these instructions were given through interpreter on the phone so I know he understood them.  His asking me for more days off at last visit also concerns me for possible factitious component, saying that he wanted to go back the following Monday instead of the day after his appointment.  Objectively he has significantly improved though not completely.  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  He will continue with PT, HEP, mobic, icing and follow up with [place] in 2 1/2 weeks.  At some point we may have to repeat MRI of right tib-fib to get complete objective picture on how he is healing if he does not progress to full duty without issues as expected.

## 2010-12-20 NOTE — Progress Notes (Signed)
Subjective:    Patient ID: Nicholas Barton, male    DOB: 1956-05-07, 55 y.o.   MRN: [id number]  HPI  HPI   55 yo M here for 2 week f/u lower leg pain s/p accident at work.   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  Unsure if he rolled his ankle when this happened.  + Swelling, bruising, unable to walk following injury to right leg.  No prior right leg fractures or severe sprains.  He went to ED following injury and had x-rays of tib-fib, ankle and foot which were all negative.  His wrist did not start hurting him until later in the day and the next day.  We x-rayed his left wrist and hand - no fractures.   MRIs were ordered of right tib-fib and left wrist since he was not improving. No complete tear of achilles seen on MRI which fit with his exam and msk ultrasound.  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  Has been in physical therapy for calf/achilles and left wrist.  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  He was to be eased from desk job back to his normal job 2 weeks ago - placed on limited hours (4 hour days) but states he has been unable to work adequately mainly because of pain in right calf, limping. He reports pain in right achilles and calf is still a 5/10 and left wrist is 4/10.  Unable to afford nitro patches for achilles so did not get these. Still taking mobic.  Uses wrist brace at work.  Past Medical History  Diagnosis Date  . Hypothyroidism     No current outpatient prescriptions on file prior to  visit.    No past surgical history on file.  Allergies  Allergen Reactions  . Penicillins     History   Social History  . Marital Status: Legally Separated    Spouse Name: N/A    Number of Children: N/A  . Years of Education: N/A   Occupational History  . Not on file.   Social History Main Topics  . Smoking status: Never Smoker   . Smokeless tobacco: Not on file  . Alcohol Use: Not on file  . Drug Use: Not on file  . Sexually Active: Not on file   Other Topics Concern  . Not on file   Social History Narrative  . No narrative on file    Family History  Problem Relation Age of Onset  . Hypertension Mother   . Hypertension Father   . Heart attack Neg Hx   . Diabetes Neg Hx     BP 142/93  Pulse 83  Temp(Src) 98.1 F (36.7 C) (Oral)  Ht 5\' 7"  (1.702 m)  Wt 192 lb (87.091 kg)  BMI 30.07 kg/m2  Review of Systems See HPI above.    Objective:   Physical Exam General: Well-developed,well-nourished,in no acute distress; alert,appropriate and cooperative throughout examination  Msk: L wrist - not examined today; exam is last visit wrist exam: No gross deformity, swelling, or bruising.  TTP diffusely 1st CMC joint, dorsal wrist, radial aspect of entire forearm but inconsistent on exam - sometimes severe pain when pressing these areas and withdraws, other times when distracted and palpating does not withdraw or wince in pain as he does when not distracted.  No snuffbox tenderness.  FROM with 5/5 strength - states hurts with flexion and extension in wrist and base of thumb.  Finger abduction, thumb opposition strength 5/5.  FROM fingers  NVI distally.   R lower leg (today):  No swelling, bruising, deformity. TTP throughout medial calf, tibia, achilles - worst at musculotendinous junction.  He also withdraws and winces in pain on palpation of dorsal foot, at level of ankle joint anteriorly, posterior tibialis tendon, peroneal tendons though inconsistently. FROM  ankle.  Achilles intact. Negative thompsons test  < 2 sec cap refill, 2+ dp pulses, sensation intact   MSK u/s: R achilles and calf - mild neovascularity near musculotendinous junction. Able to follow achilles fibers proximally to gastroc/soleus on long views.  Achilles tendon intact on transverse views.       Assessment & Plan:  1. R ankle pain - 2/2 severe achilles strain and calf strain from fall.  At this point patient's symptoms are most consistent with residual tendinopathy but parts of his exam suggest non-anatomic pain as well.  I do believe his pain worsens at work with prolonged walking, consistent with residual tendinopathy and strain that he suffered.  However, his diffuse tenderness and wincing (not at injured location), withdrawal on palpation of areas that were not injured suggests exaggeration of symptoms and a possible factitious component.  Physical therapists involved in his care have noted this exaggeration as well.  His strength testing of ankle would suggest he cannot do a toe raise but he is able to do so.  Have watched him walk briskly into clinic then start limping in our office.  On distraction he does not have as much pain in palpated areas, withdraw, or wince.  When I placed him on light duty 4 weeks ago he did not return to work, citing the pain - these instructions were given through interpreter on the phone so I know he understood them.  His asking me for more days off at last visit also concerns me for possible factitious component, saying that he wanted to go back the following Monday instead of the day after his appointment.  Objectively he has significantly improved though not completely.  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  He will continue with PT, HEP, mobic, icing and follow up with [place] in 2 1/2  weeks.  At some point we may have to repeat MRI of right tib-fib to get complete objective picture on how he is healing if he does not progress to full duty without issues as expected.  2. L wrist/hand pain - Did not reexamine this today as patient stated the leg was what has limited him at work primarily.  His area of pain at last visit is in a wider area than the localized [facility]st [facility] area that he had previously. 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 May have  worse pain because he started PT for this but his exam is inconsistent as noted above.

## 2010-12-20 NOTE — Assessment & Plan Note (Signed)
Did not reexamine this today as patient stated the leg was what has limited him at work primarily.  His area of pain at last visit is in a wider area than the localized 1st [facility] area that he had previously. 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 May have worse pain because he started PT for this but his exam is inconsistent as noted above.

## 2010-12-24 ENCOUNTER — Ambulatory Visit: Payer: Worker's Compensation | Admitting: Rehabilitation

## 2010-12-24 ENCOUNTER — Encounter: Payer: Self-pay | Admitting: Family Medicine

## 2011-01-01 ENCOUNTER — Ambulatory Visit: Payer: Worker's Compensation | Attending: Family Medicine | Admitting: Physical Therapy

## 2011-01-01 DIAGNOSIS — M25579 Pain in unspecified ankle and joints of unspecified foot: Secondary | ICD-10-CM | POA: Insufficient documentation

## 2011-01-01 DIAGNOSIS — M25676 Stiffness of unspecified foot, not elsewhere classified: Secondary | ICD-10-CM | POA: Insufficient documentation

## 2011-01-01 DIAGNOSIS — M25673 Stiffness of unspecified ankle, not elsewhere classified: Secondary | ICD-10-CM | POA: Insufficient documentation

## 2011-01-01 DIAGNOSIS — M25539 Pain in unspecified wrist: Secondary | ICD-10-CM | POA: Insufficient documentation

## 2011-01-01 DIAGNOSIS — IMO0001 Reserved for inherently not codable concepts without codable children: Secondary | ICD-10-CM | POA: Insufficient documentation

## 2011-01-03 ENCOUNTER — Ambulatory Visit: Payer: Worker's Compensation | Admitting: Rehabilitation

## 2011-01-07 ENCOUNTER — Encounter: Payer: Self-pay | Admitting: Family Medicine

## 2011-01-07 ENCOUNTER — Ambulatory Visit (INDEPENDENT_AMBULATORY_CARE_PROVIDER_SITE_OTHER): Payer: Worker's Compensation | Admitting: Family Medicine

## 2011-01-07 DIAGNOSIS — M7660 Achilles tendinitis, unspecified leg: Secondary | ICD-10-CM

## 2011-01-07 DIAGNOSIS — M79604 Pain in right leg: Secondary | ICD-10-CM

## 2011-01-07 DIAGNOSIS — M79642 Pain in left hand: Secondary | ICD-10-CM

## 2011-01-07 DIAGNOSIS — M25532 Pain in left wrist: Secondary | ICD-10-CM

## 2011-01-07 DIAGNOSIS — M79609 Pain in unspecified limb: Secondary | ICD-10-CM

## 2011-01-07 DIAGNOSIS — M25539 Pain in unspecified wrist: Secondary | ICD-10-CM

## 2011-01-07 MED ORDER — MELOXICAM 15 MG PO TABS: 15.0000 mg | ORAL_TABLET | Freq: Every day | ORAL | Status: AC

## 2011-01-07 NOTE — Patient Instructions (Signed)
Your left wrist pain is due to tendinitis in an area where you were not injured. Continue with meloxicam, icing, wrist brace, home exercises, physical therapy. We will repeat the MRI of your lower leg because your pain is continuing at the same level - after 3 months this should be significantly better, especially with physical therapy. The ultrasound of this area looks improved though the achilles is thickened consistent with tendinopathy/tendinitis. Return to work on [employer] duty, same restrictions as prior note, but now back for 8 hour days. Follow up with me a couple days after your MRI to review this.

## 2011-01-07 NOTE — Assessment & Plan Note (Signed)
Patient's current pain does not correspond to MRI findings and has changed from visit 3 weeks ago.  MRI and initial clinical exams suggested extensor tendinopathy, ? Lunate contusion vs AVN - pain improved at these areas.  Reported pain at [facility] a couple prior visits but none now - pain now palmar, radial wrist, and within extensor mass of forearm.  This is not related to his injury.  He can continue mobic, icing, wrist brace for tendinopathy.  Continue home exercise program.  No restrictions based on his wrist exam today (will assess for restrictions based on his repeat tib-fib MRI.

## 2011-01-07 NOTE — Progress Notes (Signed)
Subjective:    Patient ID: Nicholas Barton, male    DOB: [birth year], 55 y.o.   MRN: [id number]  HPI   HPI   55 yo M here for 3 week f/u right lower leg pain, left wrist pain s/p accident at work.   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  Unsure if he rolled his ankle when this happened.  + Swelling, bruising, unable to walk following injury to right leg.  No prior right leg fractures or severe sprains.  He went to ED following injury and had x-rays of tib-fib, ankle and foot which were all negative.  His wrist did not start hurting him until later in the day and the next day.  We x-rayed his left wrist and hand - no fractures.   MRIs were ordered of right tib-fib and left wrist since he was not improving. Right tib-fib MRI: edema medial head gastroc, also faint in lateral head gastroc muscle.  Marked thickening proximal-mid Achilles, high grade partial intrasubstance tears but no full thickness tearing.  Moderate edema Left wrist MRI: extensor carpi ulnaris tendinosis or partial intrasubstance tears distal to ulnar styloid, extensor tendiosynovitis in majority of tendons, Mild proximal lunate edema (likely contusion, could possibly represent AVN) Patient's pain near lunate had resolved at follow-up visit with [place] and hand surgery appointment was canceled as a result.  Has been in physical therapy for calf/achilles and left wrist, now finished 17 visits - allowed 20 by his insurance. Per PT report on exam he only maintains 2+/5 strength plantarflexion, 4/5 dorsiflexion right ankle though he can step up, ambulate without a limp or dragging right foot (3/5 strength corresponds to ability to do this against gravity). Has been at work light duty past 3 weeks, now working 6  hours/day and wearing wrist brace. Reports pain is constant 5/10 in both left wrist and right lower leg, only 20-30% improved from initial injury from what he told PT. Still taking mobic, states compliant with home exercises. Reports pain in his left wrist is no longer at base of thumb - feels pain lateral forearm in extensor mass, palmar wrist, distal radius.  Worse with weather changes.  Past Medical History  Diagnosis Date  . Hypothyroidism     Current Outpatient Prescriptions on File Prior to Visit  Medication Sig Dispense Refill  . DISCONTD: meloxicam (MOBIC) 15 MG tablet Take 15 mg by mouth daily.        Marland Kitchen DISCONTD: meloxicam (MOBIC) 15 MG tablet Take 1 tablet (15 mg total) by mouth daily. With food.  30 tablet  1    No past surgical history on file.  Allergies  Allergen Reactions  . Penicillins     History   Social History  . Marital Status: Legally Separated    Spouse Name: N/A    Number of Children: N/A  . Years of Education: N/A   Occupational History  . Not on file.   Social History Main Topics  . Smoking status: Never Smoker   . Smokeless tobacco: Not on file  . Alcohol Use: Not on file  . Drug Use: Not on file  . Sexually Active: Not on file   Other Topics Concern  . Not on file  Social History Narrative   ** Merged History Encounter **     Family History  Problem Relation Age of Onset  . Hypertension Mother   . Hypertension Father   . Heart attack Neg Hx   . Diabetes Neg Hx     BP 122/77  Pulse 72  Temp(Src) 98 F (36.7 C) (Oral)  Ht 5\' 7"  (1.702 m)  Wt 193 lb (87.544 kg)  BMI 30.23 kg/m2  Review of Systems  See HPI above.    Objective:   Physical Exam  General: Well-developed,well-nourished,in no acute distress; alert,appropriate and cooperative throughout examination   Msk: L wrist: No gross deformity, swelling, or bruising.  TTP extensor mass at elbow, throughout radial aspect of forearm, palmar aspect of wrist.  No TTP 1st  CMC joint, extensor tendons wrist- exam much different from last visit. No snuffbox tenderness.  FROM with 5/5 strength - states hurts with flexion and extension in radial wrist, forearm. Finger abduction, thumb opposition strength 5/5.  FROM fingers. NVI distally.   R lower leg:  No swelling, bruising, deformity. TTP throughout medial calf, achilles - worst at musculotendinous junction. TTP posterior tib tendon, mild TTP dorsal foot proximally. FROM ankle. ? Effort on resisted plantarflexion - 3/5 strength and stops 2/2 pain.  4/5 strength and pain in achilles and medial ankle on resisted dorsiflexion. Achilles intact. Negative thompsons test. < 2 sec cap refill, 2+ dp pulses, sensation intact   MSK u/s: R achilles and calf - no longer with neovascularity.  Tendon thickened at 0.90cm near insertion.  ? Partial intrasubstance tear near musculotendinous junction but > 90% of tendon is intact tracking from insertion proximally.       Assessment & Plan:  1. R ankle pain - 2/2 severe achilles strain and calf strain from fall.  He is now about 3 1/2 months out from injury, > 2 months since his MRI, s/p 17 PT visits and reporting only mild improvement though objectively by ultrasound appears to be mostly healed though residual tendinopathy/thickening present.  To get a more objective picture, believe we should move forward with repeat MRI to assess.  Again, parts of his exam suggest non-anatomic pain as well.  I do believe his pain worsens at work with prolonged walking, consistent with residual tendinopathy and strain that he suffered.  However, his diffuse tenderness including areas that were not injured initially suggests exaggeration of symptoms and a possible factitious component.  Physical therapists involved in his care have noted this exaggeration as well.  His strength testing of ankle (2+/5 by PT, 3/5 by me today) would suggest he cannot do a toe/calf raise but he is able to do so.  Have  watched him walk briskly into clinic then start limping in our office.  On distraction he does not have as much pain in palpated areas, withdraw, or wince.  When I placed him on light duty initially, he did not return to work, citing the pain - these instructions were given through interpreter on the phone so I know he understood them.  His asking me for more days off at last visit also concerns me for possible factitious component (when I told him I was returning him to 8 hour days, he requested I not start this until next Monday; see prior note for other requests).  Continue icing, mobic, light duty until we get tib-fib MRI results.  2. L wrist/hand pain - Patient's current pain does not correspond to MRI findings and has changed from  visit 3 weeks ago.  MRI and initial clinical exams suggested extensor tendinopathy, ? Lunate contusion vs AVN - pain improved at these areas.  Reported pain at [facility] a couple prior visits but none now - pain now palmar, radial wrist, and within extensor mass of forearm.  This is not related to his injury.  He can continue mobic, icing, wrist brace for tendinopathy.  Continue home exercise program.  No restrictions based on his wrist exam today (will assess for restrictions based on his repeat tib-fib MRI.

## 2011-01-07 NOTE — Assessment & Plan Note (Signed)
2/2 severe achilles strain and calf strain from fall.  He is now about 3 1/2 months out from injury, > 2 months since his MRI, s/p 17 PT visits and reporting only mild improvement though objectively by ultrasound appears to be mostly healed though residual tendinopathy/thickening present.  To get a more objective picture, believe we should move forward with repeat MRI to assess.  Again, parts of his exam suggest non-anatomic pain as well.  I do believe his pain worsens at work with prolonged walking, consistent with residual tendinopathy and strain that he suffered.  However, his diffuse tenderness including areas that were not injured initially suggests exaggeration of symptoms and a possible factitious component.  Physical therapists involved in his care have noted this exaggeration as well.  His strength testing of ankle (2+/5 by PT, 3/5 by me today) would suggest he cannot do a toe/calf raise but he is able to do so.  Have watched him walk briskly into clinic then start limping in our office.  On distraction he does not have as much pain in palpated areas, withdraw, or wince.  When I placed him on light duty initially, he did not return to work, citing the pain - these instructions were given through interpreter on the phone so I know he understood them.  His asking me for more days off at last visit also concerns me for possible factitious component (when I told him I was returning him to 8 hour days, he requested I not start this until next Monday; see prior note for other requests).  Continue icing, mobic, light duty until we get tib-fib MRI results.

## 2011-01-08 ENCOUNTER — Ambulatory Visit: Payer: Worker's Compensation | Attending: Family Medicine | Admitting: Physical Therapy

## 2011-01-08 DIAGNOSIS — M25579 Pain in unspecified ankle and joints of unspecified foot: Secondary | ICD-10-CM | POA: Insufficient documentation

## 2011-01-08 DIAGNOSIS — M25539 Pain in unspecified wrist: Secondary | ICD-10-CM | POA: Insufficient documentation

## 2011-01-08 DIAGNOSIS — M25676 Stiffness of unspecified foot, not elsewhere classified: Secondary | ICD-10-CM | POA: Insufficient documentation

## 2011-01-08 DIAGNOSIS — M25673 Stiffness of unspecified ankle, not elsewhere classified: Secondary | ICD-10-CM | POA: Insufficient documentation

## 2011-01-08 DIAGNOSIS — IMO0001 Reserved for inherently not codable concepts without codable children: Secondary | ICD-10-CM | POA: Insufficient documentation

## 2011-01-10 ENCOUNTER — Ambulatory Visit: Payer: Worker's Compensation | Admitting: Rehabilitation

## 2011-01-15 ENCOUNTER — Ambulatory Visit: Payer: Worker's Compensation | Admitting: Physical Therapy

## 2011-01-17 ENCOUNTER — Encounter: Payer: Self-pay | Admitting: Family Medicine

## 2011-01-17 ENCOUNTER — Ambulatory Visit: Payer: Worker's Compensation | Admitting: Rehabilitation

## 2011-01-22 ENCOUNTER — Ambulatory Visit: Payer: Worker's Compensation | Admitting: Physical Therapy

## 2011-01-24 ENCOUNTER — Encounter: Payer: Self-pay | Admitting: Family Medicine

## 2011-01-24 ENCOUNTER — Ambulatory Visit (INDEPENDENT_AMBULATORY_CARE_PROVIDER_SITE_OTHER): Payer: Worker's Compensation | Admitting: Family Medicine

## 2011-01-24 ENCOUNTER — Ambulatory Visit: Payer: Worker's Compensation | Admitting: Rehabilitation

## 2011-01-24 DIAGNOSIS — M25539 Pain in unspecified wrist: Secondary | ICD-10-CM

## 2011-01-24 DIAGNOSIS — M79604 Pain in right leg: Secondary | ICD-10-CM

## 2011-01-24 DIAGNOSIS — M79609 Pain in unspecified limb: Secondary | ICD-10-CM

## 2011-01-24 NOTE — Assessment & Plan Note (Addendum)
2/2 severe achilles strain and calf strain from fall.  Patient is 4 months s/p his injury.  MRI was performed as his reports of pain were not consistent with objective findings.  Has now finished about 20 PT visits and reporting only mild improvement.  MRI shows significant healing of his calf and achilles injuries with only minimal edema in gastroc muscle distally now (and he is not tender at this location - pain 3 cm prox to insertion of achilles, proximal medial gastroc).  Based on these objective findings, he is now cleared for full duty starting Monday.  I have no objective reason to keep him on light duty based on his MRI findings and marked improvement.  I informed him of these findings and that we will return him to regular duty through an interpreter.   As I remarked last note on 7/2, parts of his exam suggest non-anatomic pain.  I do believe his pain worsens at work with prolonged walking, consistent with mild residual tendinopathy (strain is improved objectively).  However, his diffuse tenderness including areas that were not injured initially (without findings on MRI or ultrasound to indicate tendinopathy, injury, swelling, inflammation) suggests exaggeration of symptoms and a possible factitious component.  Physical therapists involved in his care have noted this exaggeration as well.  His strength testing of ankle (2+/5 by PT, 3/5 by me on 7/2) would suggest he cannot do a toe/calf raise but he is able to do so.  Have watched him walk briskly into clinic then start limping in our office - he limped into our office again today.  On distraction he does not have as much pain in palpated areas, withdraw, or wince.  When I placed him on light duty initially, he did not return to work, citing the pain - these instructions were given through interpreter on the phone so I know he understood them.  His asking me for more days off at prior visit also concerns me for possible factitious component (when I told  him last visit I was returning him to 8 hour days, he requested I not start this until next Monday; see prior note for other requests).  He had also remarked in PT that he has gone out dancing with his wife and a friend but when asked about this he reported that he does not dance, he just watches his wife and friend dance when they go out while he sits to the side.  Addendum: Also note I obtained the MRI images on CD of his tib-fib MRI as there was a notation from the radiologist that the ankle was not in the field of view.  Wanted to ensure patient's area of pain in achilles and lower leg were within the field and confirmed that while the ankle is not in the field, patient's proximal-mid achilles is in the field (areas where patient had partial intrasubstance tearing) of view.

## 2011-01-24 NOTE — Progress Notes (Signed)
Subjective:    Patient ID: Nicholas Barton, male    DOB: October 30, 1955, 55 y.o.   MRN: [id number]  HPI   HPI   55 yo M here for 3 week f/u right lower leg pain, left wrist pain s/p accident at work.   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  Unsure if he rolled his ankle when this happened.  + Swelling, bruising, unable to walk following injury to right leg.  No prior right leg fractures or severe sprains.  He went to ED following injury and had x-rays of tib-fib, ankle and foot which were all negative.  His wrist did not start hurting him until later in the day and the next day.  We x-rayed his left wrist and hand - no fractures.   MRIs were ordered of right tib-fib and left wrist since he was not improving. Right tib-fib MRI: edema medial head gastroc, also faint in lateral head gastroc muscle.  Marked thickening proximal-mid Achilles, high grade partial intrasubstance tears but no full thickness tearing.  Moderate edema Left wrist MRI: extensor carpi ulnaris tendinosis or partial intrasubstance tears distal to ulnar styloid, extensor tendiosynovitis in majority of tendons, Mild proximal lunate edema (likely contusion, could possibly represent AVN) Patient's pain near lunate had resolved at follow-up visit with [place] and hand surgery appointment was canceled as a result.  Has been in physical therapy for calf/achilles and left wrist, now finished 17 visits - allowed 20 by his insurance. Per PT report on exam he only maintains 2+/5 strength plantarflexion, 4/5 dorsiflexion right ankle though he can step up, ambulate without a limp or dragging right foot (3/5 strength corresponds to ability to do this against gravity). Has been at work light duty past 3 weeks, now  working 6 hours/day and wearing wrist brace. Reports pain is constant 5/10 in both left wrist and right lower leg, only 20-30% improved from initial injury from what he told PT. Still taking mobic, states compliant with home exercises. Reports pain in his left wrist is no longer at base of thumb - feels pain lateral forearm in extensor mass, palmar wrist, distal radius.  Worse with weather changes.  Today, 7/19: Patient brought back today to review his MRI result of right tibia-fibula. MRI shows significant improvement of Achilles, calf strains.  Normal signal intensity of lower leg musculature with now minimal edema in lower gastroc muscle (previously with marked Achilles thickening and high grade tears of Achilles, moderate edema or calf muscle). He reports his pain is a 4/10 still. Has gone to PT for > 2 months. Reports still having pain especially with walking, going up and down stairs. Wrist still at a 4/10 as well.  Past Medical History  Diagnosis Date  . Hypothyroidism     Current Outpatient Prescriptions on File Prior to Visit  Medication Sig Dispense Refill  . meloxicam (MOBIC) 15 MG tablet Take 1 tablet (15 mg total) by mouth daily. With food.  30 tablet  1    No past surgical history on file.  Allergies  Allergen Reactions  . Penicillins     History   Social History  . Marital Status: Legally Separated    Spouse Name: N/A  Number of Children: N/A  . Years of Education: N/A   Occupational History  . Not on file.   Social History Main Topics  . Smoking status: Never Smoker   . Smokeless tobacco: Not on file  . Alcohol Use: Not on file  . Drug Use: Not on file  . Sexually Active: Not on file   Other Topics Concern  . Not on file   Social History Narrative   ** Merged History Encounter **     Family History  Problem Relation Age of Onset  . Hypertension Mother   . Hypertension Father   . Heart attack Neg Hx   . Diabetes Neg Hx     BP 127/86   Pulse 68  Temp(Src) 98.6 F (37 C) (Oral)  Ht 5\' 6"  (1.676 m)  Wt 193 lb (87.544 kg)  BMI 31.15 kg/m2  Review of Systems  See HPI above.    Objective:   Physical Exam  General: Well-developed,well-nourished,in no acute distress; alert,appropriate and cooperative throughout examination   Msk: L wrist (exam from last visit on 7/2 - did not exam today as he was cleared last visit): No gross deformity, swelling, or bruising.  TTP extensor mass at elbow, throughout radial aspect of forearm, palmar aspect of wrist.  No TTP 1st CMC joint, extensor tendons wrist- exam much different from last visit. No snuffbox tenderness.  FROM with 5/5 strength - states hurts with flexion and extension in radial wrist, forearm. Finger abduction, thumb opposition strength 5/5.  FROM fingers. NVI distally.   R lower leg (today):  No swelling, bruising, deformity. TTP throughout medial calf, achilles - now worst mid-achilles about 3 cm proximal to insertion (last visit was worst at musculotendinous junction). Mild TTP posterior tib tendon, mild TTP dorsal foot proximally. FROM ankle.  Achilles intact. Negative thompsons test. < 2 sec cap refill, 2+ dp pulses, sensation intact       Assessment & Plan:  1. R ankle pain - 2/2 severe achilles strain and calf strain from fall.  Patient is 4 months s/p his injury.  MRI was performed as his reports of pain were not consistent with objective findings.  Has now finished about 20 PT visits and reporting only mild improvement.  MRI shows significant healing of his calf and achilles injuries with only minimal edema in gastroc muscle distally now (and he is not tender at this location - pain 3 cm prox to insertion of achilles, proximal medial gastroc).  Based on these objective findings, he is now cleared for full duty starting Monday.  I have no objective reason to keep him on light duty based on his MRI findings and marked improvement.  I informed him of these  findings and that we will return him to regular duty through an interpreter.   As I remarked last note on 7/2, parts of his exam suggest non-anatomic pain.  I do believe his pain worsens at work with prolonged walking, consistent with mild residual tendinopathy (strain is improved objectively).  However, his diffuse tenderness including areas that were not injured initially (without findings on MRI or ultrasound to indicate tendinopathy, injury, swelling, inflammation) suggests exaggeration of symptoms and a possible factitious component.  Physical therapists involved in his care have noted this exaggeration as well.  His strength testing of ankle (2+/5 by PT, 3/5 by me on 7/2) would suggest he cannot do a toe/calf raise but he is able to do so.  Have watched him walk briskly into clinic  then start limping in our office - he limped into our office again today.  On distraction he does not have as much pain in palpated areas, withdraw, or wince.  When I placed him on light duty initially, he did not return to work, citing the pain - these instructions were given through interpreter on the phone so I know he understood them.  His asking me for more days off at prior visit also concerns me for possible factitious component (when I told him last visit I was returning him to 8 hour days, he requested I not start this until next Monday; see prior note for other requests).  He had also remarked in PT that he has gone out dancing with his wife and a friend but when asked about this he reported that he does not dance, he just watches his wife and friend dance when they go out while he sits to the side.  2. L wrist/hand pain - Patient was cleared at last OV from his wrist and hand pain.  Patient's current pain does not correspond to MRI findings and changed from his prior visit.  MRI and initial clinical exams suggested extensor tendinopathy, ? Lunate contusion vs AVN - pain improved at these areas.  Reported pain at [facility]st  [facility] a couple prior visits but none on 7/2 - pain moved to palmar, radial wrist, and within extensor mass of forearm.  This is not related to his injury.  He can continue mobic, icing, wrist brace for tendinopathy.  Continue home exercise program.  Of note, patient was seen in [facility] lobby with brace off typing at computer without difficulty.  Reports he needs the brace but witnessed him putting this on as he walked into the [facility].

## 2011-01-24 NOTE — Assessment & Plan Note (Signed)
Patient was cleared at last OV from his wrist and hand pain.  Patient's current pain does not correspond to MRI findings and changed from his prior visit.  MRI and initial clinical exams suggested extensor tendinopathy, ? Lunate contusion vs AVN - pain improved at these areas.  Reported pain at [facility] a couple prior visits but none on 7/2 - pain moved to palmar, radial wrist, and within extensor mass of forearm.  This is not related to his injury.  He can continue mobic, icing, wrist brace for tendinopathy.  Continue home exercise program.  Of note, patient was seen in [facility] lobby with brace off typing at computer without difficulty.  Reports he needs the brace but witnessed him putting this on as he walked into the [facility].

## 2011-01-29 ENCOUNTER — Ambulatory Visit: Payer: Self-pay | Admitting: Family Medicine

## 2011-01-29 ENCOUNTER — Ambulatory Visit: Payer: Worker's Compensation | Admitting: Physical Therapy

## 2011-01-31 ENCOUNTER — Ambulatory Visit: Payer: Worker's Compensation | Admitting: Rehabilitation

## 2011-02-01 ENCOUNTER — Encounter: Payer: Self-pay | Admitting: Family Medicine

## 2011-02-05 ENCOUNTER — Ambulatory Visit: Payer: Worker's Compensation | Admitting: Rehabilitation

## 2011-02-07 ENCOUNTER — Ambulatory Visit: Payer: Worker's Compensation | Attending: Family Medicine | Admitting: Rehabilitation

## 2011-02-07 DIAGNOSIS — IMO0001 Reserved for inherently not codable concepts without codable children: Secondary | ICD-10-CM | POA: Insufficient documentation

## 2011-02-07 DIAGNOSIS — M25673 Stiffness of unspecified ankle, not elsewhere classified: Secondary | ICD-10-CM | POA: Insufficient documentation

## 2011-02-07 DIAGNOSIS — M25539 Pain in unspecified wrist: Secondary | ICD-10-CM | POA: Insufficient documentation

## 2011-02-07 DIAGNOSIS — M25676 Stiffness of unspecified foot, not elsewhere classified: Secondary | ICD-10-CM | POA: Insufficient documentation

## 2011-02-07 DIAGNOSIS — M25579 Pain in unspecified ankle and joints of unspecified foot: Secondary | ICD-10-CM | POA: Insufficient documentation

## 2011-02-12 ENCOUNTER — Ambulatory Visit: Payer: Worker's Compensation | Admitting: Rehabilitation

## 2011-02-15 ENCOUNTER — Ambulatory Visit: Payer: Worker's Compensation | Admitting: Rehabilitation

## 2011-02-21 ENCOUNTER — Ambulatory Visit (INDEPENDENT_AMBULATORY_CARE_PROVIDER_SITE_OTHER): Payer: Worker's Compensation | Admitting: Family Medicine

## 2011-02-21 ENCOUNTER — Ambulatory Visit (HOSPITAL_BASED_OUTPATIENT_CLINIC_OR_DEPARTMENT_OTHER)
Admission: RE | Admit: 2011-02-21 | Discharge: 2011-02-21 | Disposition: A | Discharge status: Home / Self Care | Payer: Worker's Compensation | Source: Ambulatory Visit | Attending: Family Medicine | Admitting: Family Medicine

## 2011-02-21 VITALS — BP 113/77

## 2011-02-21 DIAGNOSIS — M546 Pain in thoracic spine: Secondary | ICD-10-CM

## 2011-02-21 DIAGNOSIS — M549 Dorsalgia, unspecified: Secondary | ICD-10-CM

## 2011-02-21 MED ORDER — IBUPROFEN 600 MG PO TABS: 600.0000 mg | ORAL_TABLET | Freq: Four times a day (QID) | ORAL | Status: AC | PRN

## 2011-02-21 MED ORDER — CYCLOBENZAPRINE HCL 5 MG PO TABS: 5.0000 mg | ORAL_TABLET | Freq: Three times a day (TID) | ORAL | Status: AC | PRN

## 2011-02-21 NOTE — Patient Instructions (Signed)
You have a thoracic strain of your back. Take tylenol for baseline pain relief (1-2 extra strength tabs 3x/day) Aleve (naproxen) twice a day with food for pain and inflammation. Flexeril as needed for muscle spasms (no driving on this medicine). Stay as active as possible. Start physical therapy for your upper back to learn exercises to do every day. Consider massage, chiropractor, physical therapy, and/or acupuncture. Physical therapy has been shown to be helpful while the others have mixed results. Strengthening of back muscles, is key for long term pain relief. Follow up with me as needed - MRI tends not to be helpful for this area - it's unlikely to have a pinched nerve in your thoracic spine that would respond to injection or surgery.

## 2011-02-23 ENCOUNTER — Encounter: Payer: Self-pay | Admitting: Family Medicine

## 2011-02-23 DIAGNOSIS — M549 Dorsalgia, unspecified: Secondary | ICD-10-CM | POA: Insufficient documentation

## 2011-02-23 NOTE — Assessment & Plan Note (Signed)
Uncertain if this started for the first time with his injury in March - he only reported this to me once in the past 5 months since injury and we have had several follow-ups since then.  X-rays performed and no evidence of bony abnormalities.  Switch nsaids as mobic is not providing adequate relief - advised to take ibuprofen 600mg  with food 3-4 times a day as needed for pain.  Flexeril for spasms.  Advised him to do PT for at least 1-3 visits to learn a home exercise program - he stated he did not want to do physical therapy and thought it 'would not be good for [me].'  Chiropractor, massage, and acupuncture are considerations but results are mixed- physical therapy would be the next step.  He again declined.  Then also asked about being put on light duty again remarking that he's been told light duty is available and his co-workers said he should be on light duty with how much pain he is in - advised him he does not need to be on light duty for thoracic spasms/strain.  He was unhappy with this - I told him if he is unhappy with his care here, he can speak to his lawyer about getting a second opinion from a different office.  Please see prior notes regarding concerns about secondary gain/possible factitious component.  He can call us if he changes his mind regarding PT.  Otherwise follow-up with me as needed.  No restrictions.

## 2011-02-23 NOTE — Progress Notes (Signed)
Subjective:    Patient ID: Nicholas Barton, male    DOB: 01/18/1956, 55 y.o.   MRN: [id number]  Back Pain    HPI   55 yo M here for c/o upper back pain that he states started with his accident on 3/15.   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  Unsure if he rolled his ankle when this happened.  + Swelling, bruising, unable to walk following injury to right leg.  No prior right leg fractures or severe sprains.  He went to ED following injury and had x-rays of tib-fib, ankle and foot which were all negative.  His wrist did not start hurting him until later in the day and the next day.  We x-rayed his left wrist and hand - no fractures.   MRIs were ordered of right tib-fib and left wrist since he was not improving. Right tib-fib MRI: edema medial head gastroc, also faint in lateral head gastroc muscle.  Marked thickening proximal-mid Achilles, high grade partial intrasubstance tears but no full thickness tearing.  Moderate edema Left wrist MRI: extensor carpi ulnaris tendinosis or partial intrasubstance tears distal to ulnar styloid, extensor tendiosynovitis in majority of tendons, Mild proximal lunate edema (likely contusion, could possibly represent AVN) Patient's pain near lunate had resolved at follow-up visit with [place] and hand surgery appointment was canceled as a result.  Has been in physical therapy for calf/achilles and left wrist, now finished 17 visits - allowed 20 by his insurance. Per PT report on exam he only maintains 2+/5 strength plantarflexion, 4/5 dorsiflexion right ankle though he can step up, ambulate without a limp or dragging right foot (3/5 strength corresponds to ability to do this against gravity). Has been at work light duty past 3  weeks, now working 6 hours/day and wearing wrist brace. Reports pain is constant 5/10 in both left wrist and right lower leg, only 20-30% improved from initial injury from what he told PT. Still taking mobic, states compliant with home exercises. Reports pain in his left wrist is no longer at base of thumb - feels pain lateral forearm in extensor mass, palmar wrist, distal radius.  Worse with weather changes.  7/19: Patient brought back today to review his MRI result of right tibia-fibula. MRI shows significant improvement of Achilles, calf strains.  Normal signal intensity of lower leg musculature with now minimal edema in lower gastroc muscle (previously with marked Achilles thickening and high grade tears of Achilles, moderate edema or calf muscle). He reports his pain is a 4/10 still. Has gone to PT for > 2 months. Reports still having pain especially with walking, going up and down stairs. Wrist still at a 4/10 as well.  8/16: Patient reports his upper back has hurt since his accident noted above on 3/15. He only mentioned this once to me on 4/30 in passing - after finishing his visit, he asked me for a medicine for muscle spasms for his calf and back - flexeril was prescribed, was not brought up at follow-up visits and not mentioned on his initial visit to the emergency department. He reports pain is mostly in upper back, hurts more with neck extension when he has to look upward to do his job.  Has not tried PT for back or asked for this when going to PT for left wrist and right calf/achilles. Taking mobic which he states is not helping and has irritated his stomach. Is using icy/hot patches. Reports localized tingling in upper back but does not endorse radiation into limbs. No bowel/bladder dysfunction.  Past Medical History  Diagnosis Date  . Hypothyroidism     Current Outpatient Prescriptions on File Prior to Visit  Medication Sig Dispense Refill  . meloxicam (MOBIC) 15 MG  tablet Take 1 tablet (15 mg total) by mouth daily. With food.  30 tablet  1    No past surgical history on file.  Allergies  Allergen Reactions  . Penicillins     History   Social History  . Marital Status: Legally Separated    Spouse Name: N/A    Number of Children: N/A  . Years of Education: N/A   Occupational History  . Not on file.   Social History Main Topics  . Smoking status: Never Smoker   . Smokeless tobacco: Not on file  . Alcohol Use: Not on file  . Drug Use: Not on file  . Sexually Active: Not on file   Other Topics Concern  . Not on file   Social History Narrative   ** Merged History Encounter **     Family History  Problem Relation Age of Onset  . Hypertension Mother   . Hypertension Father   . Heart attack Neg Hx   . Diabetes Neg Hx     BP 113/77  Review of Systems  Musculoskeletal: Positive for back pain.   See HPI above.    Objective:   Physical Exam  General: Well-developed,well-nourished,in no acute distress; alert,appropriate and cooperative throughout examination   Neck/Upper Back: No gross deformity, swelling, bruising. Mild thoracic paraspinal TTP bilaterally.  No midline/bony TTP. FROM neck - pain with extension in upper back > with flexion. BUE strength 5/5.  Sensation intact to light touch.  2+ equal reflexes in triceps, biceps, brachioradialis tendons. Negative spurlings. NV intact distal BUEs.     Assessment & Plan:  1. Thoracic spasms/strain - Uncertain if this started for the first time with his injury in March - he only reported this to me once in the past 5 months since injury and we have had several follow-ups since then.  X-rays performed and no evidence of bony abnormalities.  Switch nsaids as mobic is not providing adequate relief - advised to take ibuprofen 600mg  with food 3-4 times a day as needed for pain.  Flexeril for spasms.  Advised him to do PT for at least 1-3 visits to learn a home exercise program - he  stated he did not want to do physical therapy and thought it 'would not be good for [me].'  Chiropractor, massage, and acupuncture are considerations but results are mixed- physical therapy would be the next step.  He again declined.  Then also asked about being put on light duty again remarking that he's been told light duty is available and his co-workers said he should be on light duty with how much pain he is in - advised him he does not need to be on light duty for thoracic spasms/strain.  He was unhappy with this - I told him if he is unhappy with his care here, he can speak to his lawyer about getting a second opinion from a different office.  Please see prior notes regarding concerns about secondary gain/possible factitious component.  He can call us if he changes his mind regarding PT.  Otherwise follow-up with me as needed.  No restrictions.

## 2011-04-10 ENCOUNTER — Other Ambulatory Visit: Payer: Self-pay | Admitting: Internal Medicine

## 2011-04-10 ENCOUNTER — Ambulatory Visit (HOSPITAL_COMMUNITY)
Admission: RE | Admit: 2011-04-10 | Discharge: 2011-04-10 | Disposition: A | Discharge status: Home / Self Care | Payer: Self-pay | Source: Ambulatory Visit | Attending: Internal Medicine | Admitting: Internal Medicine

## 2011-04-10 DIAGNOSIS — M25519 Pain in unspecified shoulder: Secondary | ICD-10-CM | POA: Insufficient documentation

## 2011-04-10 DIAGNOSIS — R52 Pain, unspecified: Secondary | ICD-10-CM

## 2011-04-29 ENCOUNTER — Ambulatory Visit: Payer: Self-pay | Admitting: Family Medicine

## 2011-05-27 ENCOUNTER — Ambulatory Visit: Payer: Self-pay | Admitting: Family Medicine

## 2011-05-28 ENCOUNTER — Encounter: Payer: Self-pay | Admitting: Family Medicine

## 2011-05-28 ENCOUNTER — Ambulatory Visit (INDEPENDENT_AMBULATORY_CARE_PROVIDER_SITE_OTHER): Payer: Worker's Compensation | Admitting: Family Medicine

## 2011-05-28 DIAGNOSIS — M25539 Pain in unspecified wrist: Secondary | ICD-10-CM

## 2011-05-28 DIAGNOSIS — M25579 Pain in unspecified ankle and joints of unspecified foot: Secondary | ICD-10-CM

## 2011-05-28 DIAGNOSIS — M546 Pain in thoracic spine: Secondary | ICD-10-CM

## 2011-05-28 DIAGNOSIS — M25532 Pain in left wrist: Secondary | ICD-10-CM

## 2011-05-28 DIAGNOSIS — M549 Dorsalgia, unspecified: Secondary | ICD-10-CM

## 2011-05-29 ENCOUNTER — Encounter: Payer: Self-pay | Admitting: Family Medicine

## 2011-05-29 NOTE — Progress Notes (Signed)
Subjective:    Patient ID: Nicholas Barton, male    DOB: 08-10-1955, 55 y.o.   MRN: [id number]  Back Pain  55 yo M here for f/u of left wrist and right achilles.   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  Unsure if he rolled his ankle when this happened.  + Swelling, bruising, unable to walk following injury to right leg.  No prior right leg fractures or severe sprains.  He went to ED following injury and had x-rays of tib-fib, ankle and foot which were all negative.  His wrist did not start hurting him until later in the day and the next day.  We x-rayed his left wrist and hand - no fractures.   MRIs were ordered of right tib-fib and left wrist since he was not improving. Right tib-fib MRI: edema medial head gastroc, also faint in lateral head gastroc muscle.  Marked thickening proximal-mid Achilles, high grade partial intrasubstance tears but no full thickness tearing.  Moderate edema Left wrist MRI: extensor carpi ulnaris tendinosis or partial intrasubstance tears distal to ulnar styloid, extensor tendiosynovitis in majority of tendons, Mild proximal lunate edema (likely contusion, could possibly represent AVN) Patient's pain near lunate had resolved at follow-up visit with [place] and hand surgery appointment was canceled as a result.  Has been in physical therapy for calf/achilles and left wrist, now finished 17 visits - allowed 20 by his insurance. Per PT report on exam he only maintains 2+/5 strength plantarflexion, 4/5 dorsiflexion right ankle though he can step up, ambulate without a limp or dragging right foot (3/5 strength corresponds to ability to do this against gravity). Has been at work light duty past 3 weeks, now working 6 hours/day and wearing wrist  brace. Reports pain is constant 5/10 in both left wrist and right lower leg, only 20-30% improved from initial injury from what he told PT. Still taking mobic, states compliant with home exercises. Reports pain in his left wrist is no longer at base of thumb - feels pain lateral forearm in extensor mass, palmar wrist, distal radius.  Worse with weather changes.  7/19: Patient brought back today to review his MRI result of right tibia-fibula. MRI shows significant improvement of Achilles, calf strains.  Normal signal intensity of lower leg musculature with now minimal edema in lower gastroc muscle (previously with marked Achilles thickening and high grade tears of Achilles, moderate edema or calf muscle). He reports his pain is a 4/10 still. Has gone to PT for > 2 months. Reports still having pain especially with walking, going up and down stairs. Wrist still at a 4/10 as well.  8/16: Patient reports his upper back has hurt since his accident noted above on 3/15. He only mentioned this once to me on 4/30 in passing - after finishing his visit, he asked me for a medicine for muscle spasms for his calf and back - flexeril was prescribed, was not brought up at follow-up visits and not mentioned on his initial visit to the emergency department. He reports pain is mostly in upper back, hurts more with neck extension when he has to look upward to do his job. Has not tried PT for back or asked for this when going to  PT for left wrist and right calf/achilles. Taking mobic which he states is not helping and has irritated his stomach. Is using icy/hot patches. Reports localized tingling in upper back but does not endorse radiation into limbs. No bowel/bladder dysfunction.  11/20: Patient reports his pain has improved since last visit and is no longer constant. Gets up to a 3/10 at times in his achilles mostly with climbing stairs. Pain in left wrist is mostly on palmar side, near base of thumb and  worse with twisting motions while at work. States he has been compliant with home exercises. Takes ibuprofen occasionally and ices the areas. No bruising or swelling. Is back at work in usual capacity. Interpreter present for today's visit.  Past Medical History  Diagnosis Date  . Hypothyroidism     Current Outpatient Prescriptions on File Prior to Visit  Medication Sig Dispense Refill  . cyclobenzaprine (FLEXERIL) 5 MG tablet Take 1 tablet (5 mg total) by mouth every 8 (eight) hours as needed for muscle spasms.  60 tablet  0  . meloxicam (MOBIC) 15 MG tablet Take 1 tablet (15 mg total) by mouth daily. With food.  30 tablet  1    No past surgical history on file.  Allergies  Allergen Reactions  . Penicillins     History   Social History  . Marital Status: Legally Separated    Spouse Name: N/A    Number of Children: N/A  . Years of Education: N/A   Occupational History  . Not on file.   Social History Main Topics  . Smoking status: Never Smoker   . Smokeless tobacco: Not on file  . Alcohol Use: Not on file  . Drug Use: Not on file  . Sexually Active: Not on file   Other Topics Concern  . Not on file   Social History Narrative   ** Merged History Encounter **     Family History  Problem Relation Age of Onset  . Hypertension Mother   . Hypertension Father   . Heart attack Neg Hx   . Diabetes Neg Hx     BP 135/89  Pulse 83  Temp(Src) 98.1 F (36.7 C) (Oral)  Ht 5\' 6"  (1.676 m)  Wt 188 lb (85.276 kg)  BMI 30.34 kg/m2  Review of Systems  Musculoskeletal: Positive for back pain.   See HPI above.    Objective:   Physical Exam  General: Well-developed,well-nourished,in no acute distress; alert,appropriate and cooperative throughout examination   R ankle: No gross deformity, swelling, ecchymoses. FROM with 5/5 strength plantar/dorsiflexion, internal and external rotation - mild pain on resisted dorsi- and plantar flexion within achilles. TTP 2-3 cm  proximal to achilles insertion on calcaneus.  No gastroc, post tib, dorsal, peroneal tenderness. Negative ant drawer and talar tilt.   Negative syndesmotic compression. Thompsons test negative. NV intact distally.  L wrist: No gross deformity, swelling, bruising. Mild TTP over flexor carpi radialis as well as 1st CMC joint.  No TTP 1st dorsal compartment, metacarpals, extensor tendons, elsewhere about wrist or hand. FROM wrist and all digits.  Minimal pain with all thumb motions.   Strength 5/5 finger abduction, thumb opposition, extension. Negative Finkelsteins.  Of note, patient's exam today exhibited minimal exaggeration of symptoms, no withdrawal on examination, and full strength as would fit with his objective signs.  MSK u/s: No evidence increased neovascularity of achilles tendon on right.  Near insertion, thickness of achilles measures 0.63cm on right, 0.71cm on left - however, more proximally  he does have thickening of the right achilles measuring 0.96cm at largest thickness - again no evidence of neovascularity, full thickness tearing.    Assessment & Plan:  1. Right ankle pain - 2/2 severe achilles strain and calf strain from fall on 09/20/10.  Now 8 months out from initial injury.  Today's exam is consistent with what I would expect having healed from his injury with possible mild residual tendinopathy.  Of note, while in prior visits (especially 6/14, 7/2, 7/19) he was exaggerating his symptoms, findings were not consistent with objective findings (MRI, ultrasound) or natural history of his injury's healing process; today he did not wince, withdraw inappropriately, lack pain on distraction, nor did he have findings inconsistent with a healed achilles/calf strain (when he had pain dorsal foot, posterior tibialis tendon, peroneals, different portions of achilles that didn't fit with his injury).  He only has intermittent pain at this point and has been able to do his job with mild  soreness when using stairs a lot.  He has full motion and strength in all directions of ankle.    Advised at this point will discharge him from my care for this and wrist.  To continue with home exercise program most days of the week.  Work without restrictions.  Some chronic tendinopathies benefit from trial of nitro patches (most studies by Paoloni et al) but patient could not afford these in the past.    Based on guidelines from [employer], he does not have a permanent impairment (0% impairment) as he does not have ankylosis or limitation of motion as a result of his injury.  Objectively if he has true symptoms they are mild and have healed based on MRI and ultrasound findings though as noted it's possible to have at worst a mild tendinopathy with normal studies (is typical for achilles to be thickened after partial tears have healed).  2. Left wrist/hand pain - Initially patient had motion limitation, ankylosis as a result of his fall but his motion has completely improved.  Of concern has been that his pain has not been consistent - many different locations and severities with similar evidence of symptom exaggeration.  Following injury, MRI was consistent with traumatic extensor tendinopathy, lunate contusion (? AVN but his pain resolved here prior to referral to hand surgeon so this visit was cancelled).  His pain in these areas has resolved.  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  He has full motion of 1st CMC joint as well.  I believe his findings/symptoms from injury at work have since resolved.  He is not left with ankylosis or decreased motion of thumb or wrist as a result of his injuries and as such also has an impairment rating of 0% of his upper extremity.  3. Upper back pain - not reevaluated today but based on prior visit, this is not associated with any impairment.

## 2011-06-04 NOTE — Assessment & Plan Note (Signed)
2/2 severe achilles strain and calf strain from fall on 09/20/10.  Now 8 months out from initial injury.  Today's exam is consistent with what I would expect having healed from his injury with possible mild residual tendinopathy.  Of note, while in prior visits (especially 6/14, 7/2, 7/19) he was exaggerating his symptoms, findings were not consistent with objective findings (MRI, ultrasound) or natural history of his injury's healing process; today he did not wince, withdraw inappropriately, lack pain on distraction, nor did he have findings inconsistent with a healed achilles/calf strain (when he had pain dorsal foot, posterior tibialis tendon, peroneals, different portions of achilles that didn't fit with his injury).  He only has intermittent pain at this point and has been able to do his job with mild soreness when using stairs a lot.  He has full motion and strength in all directions of ankle.    Advised at this point will discharge him from my care for this and wrist.  To continue with home exercise program most days of the week.  Work without restrictions.  Some chronic tendinopathies benefit from trial of nitro patches (most studies by Paoloni et al) but patient could not afford these in the past.    Based on guidelines from [employer], he does not have a permanent impairment (0% impairment) as he does not have ankylosis or limitation of motion as a result of his injury.  Objectively if he has true symptoms they are mild and have healed based on MRI and ultrasound findings though as noted it's possible to have at worst a mild tendinopathy with normal studies (is typical for achilles to be thickened after partial tears have healed).

## 2011-06-04 NOTE — Assessment & Plan Note (Signed)
not reevaluated today but based on prior visit, this is not associated with any impairment.

## 2011-06-04 NOTE — Assessment & Plan Note (Signed)
Left wrist/hand pain - Initially patient had motion limitation, ankylosis as a result of his fall but his motion has completely improved.  Of concern has been that his pain has not been consistent - many different locations and severities with similar evidence of symptom exaggeration.  Following injury, MRI was consistent with traumatic extensor tendinopathy, lunate contusion (? AVN but his pain resolved here prior to referral to hand surgeon so this visit was cancelled).  His pain in these areas has resolved.  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  He has full motion of 1st CMC joint as well.  I believe his findings/symptoms from injury at work have since resolved.  He is not left with ankylosis or decreased motion of thumb or wrist as a result of his injuries and as such also has an impairment rating of 0% of his upper extremity.

## 2012-02-19 IMAGING — CR DG HAND COMPLETE 3+V*L*
3 series · 3 of 3 positions shown · non-contrast
Comparison: None.

CLINICAL DATA: Left hand and wrist pain.

LEFT HAND - COMPLETE 3+ VIEW

[x hand pa left]
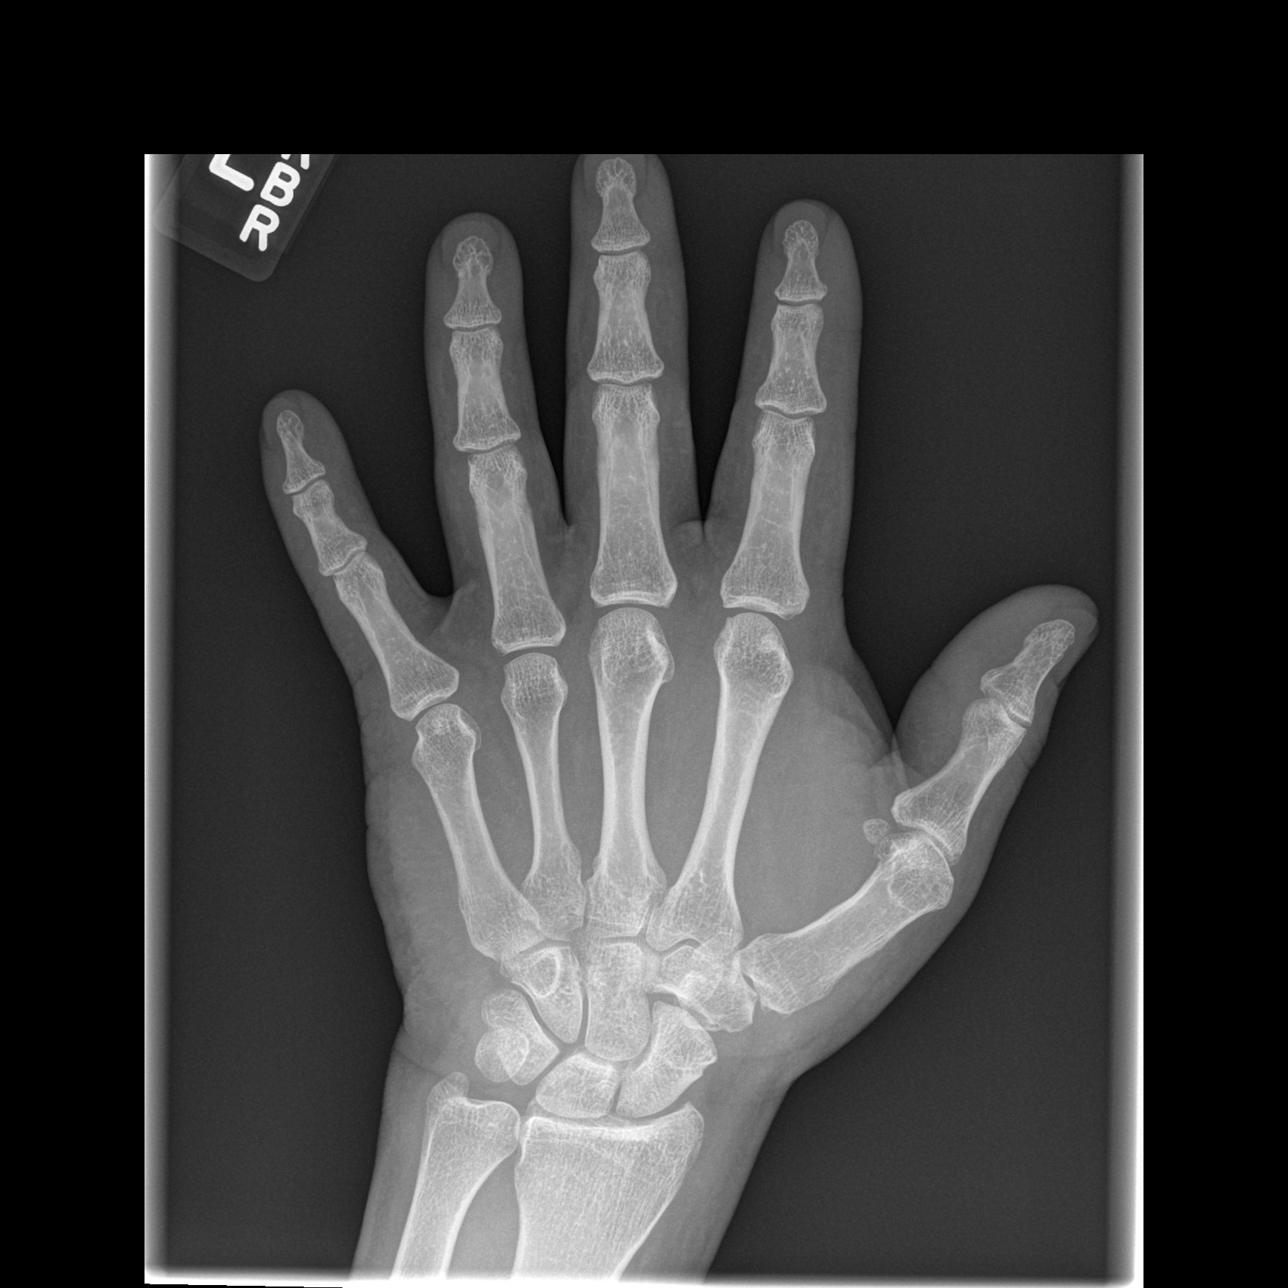

[x hand oblique left]
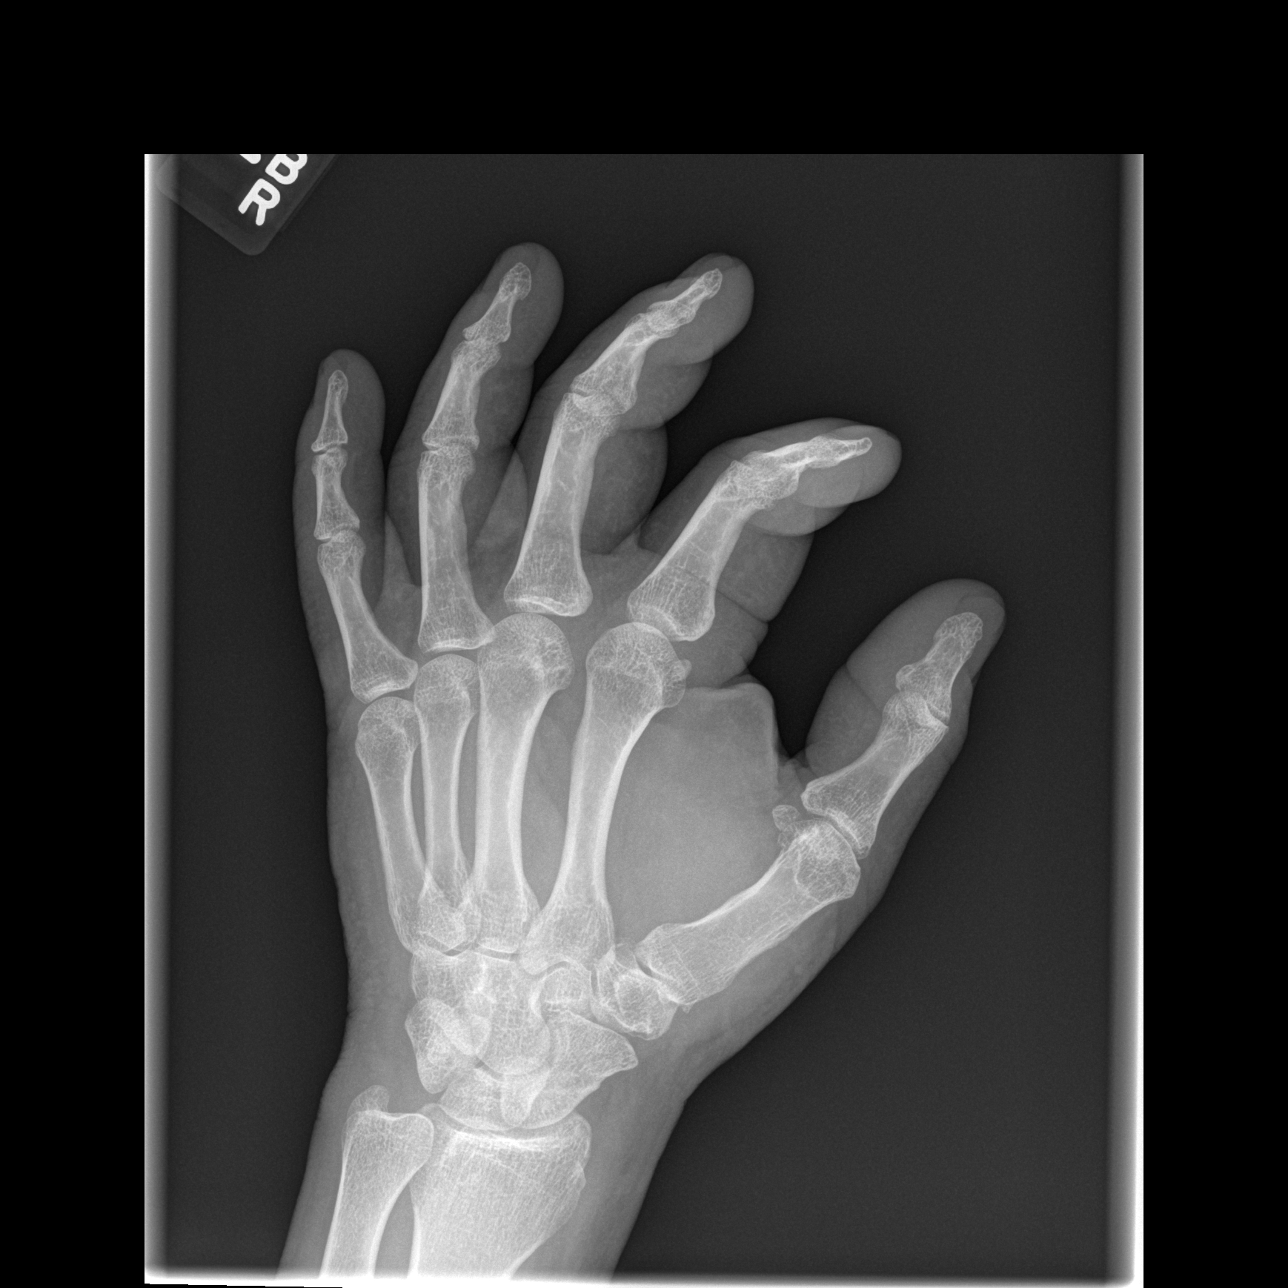

[x hand lat left]
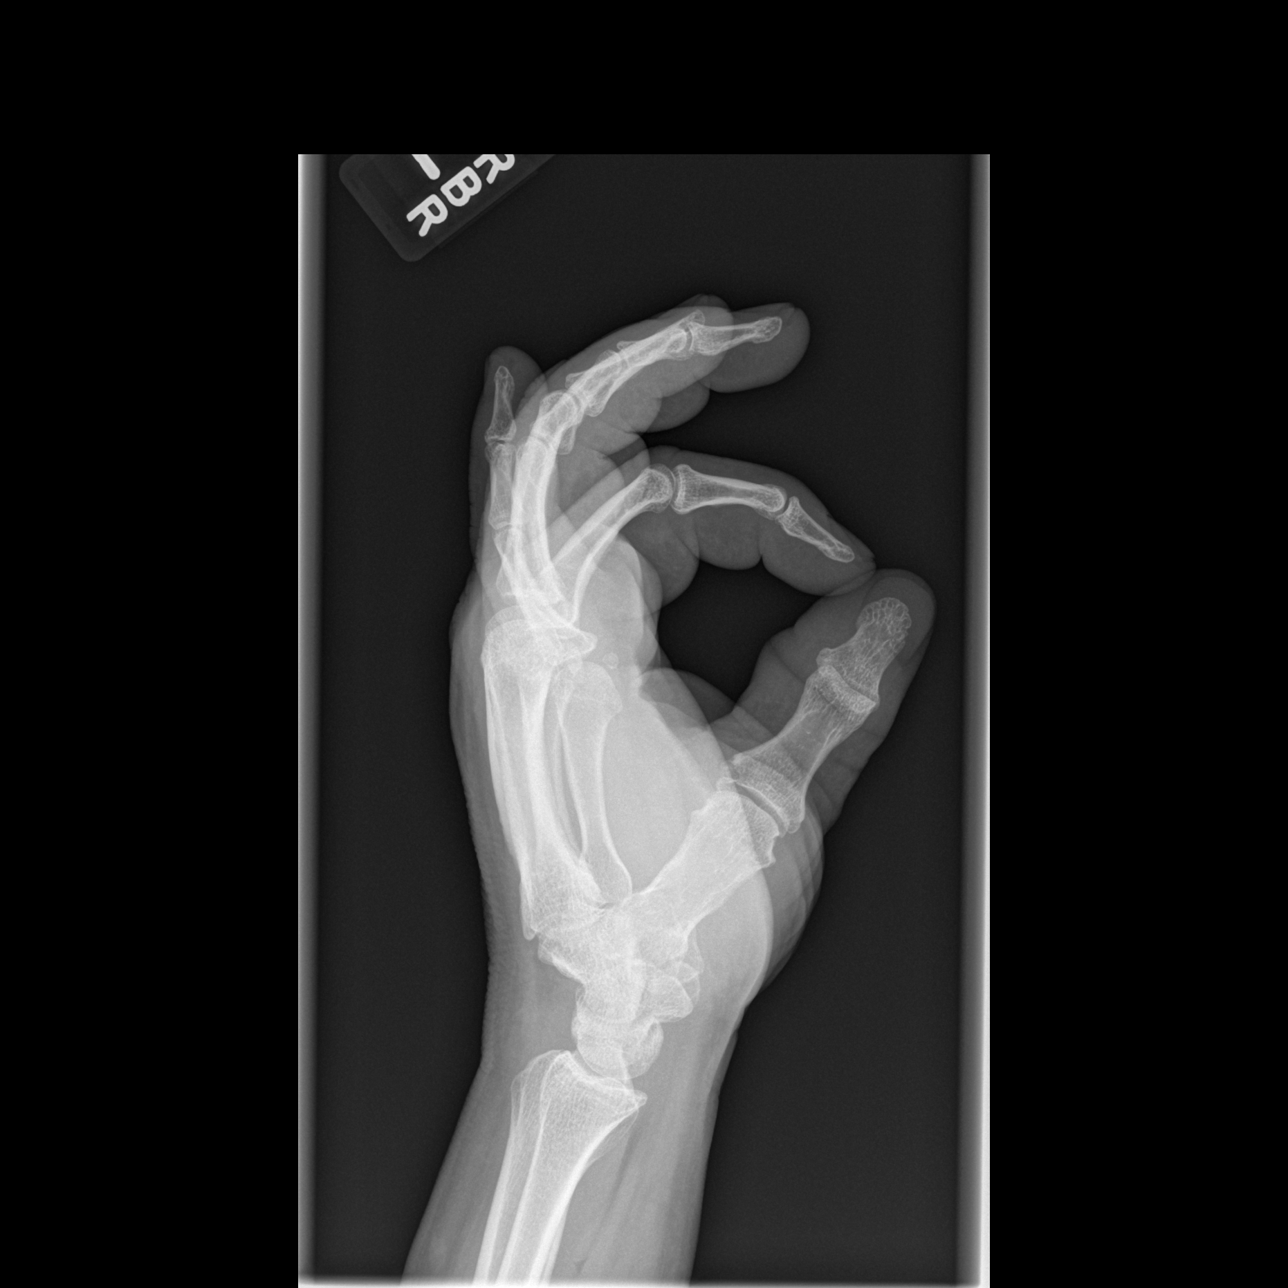

[3 of 3 positions shown; findings below may reference images not displayed]

FINDINGS: No acute osseous or joint abnormality.  Mild degenerative
changes at the first carpometacarpal and scaphoid trapezium
trapezoid joints.
IMPRESSION: No acute osseous or joint abnormality.

## 2012-07-18 IMAGING — CR DG THORACIC SPINE 2V
3 series · 3 of 3 positions shown · non-contrast
Comparison: None

CLINICAL DATA: Back pain.

THORACIC SPINE - 2 VIEW

[w t-spine a.p. *]
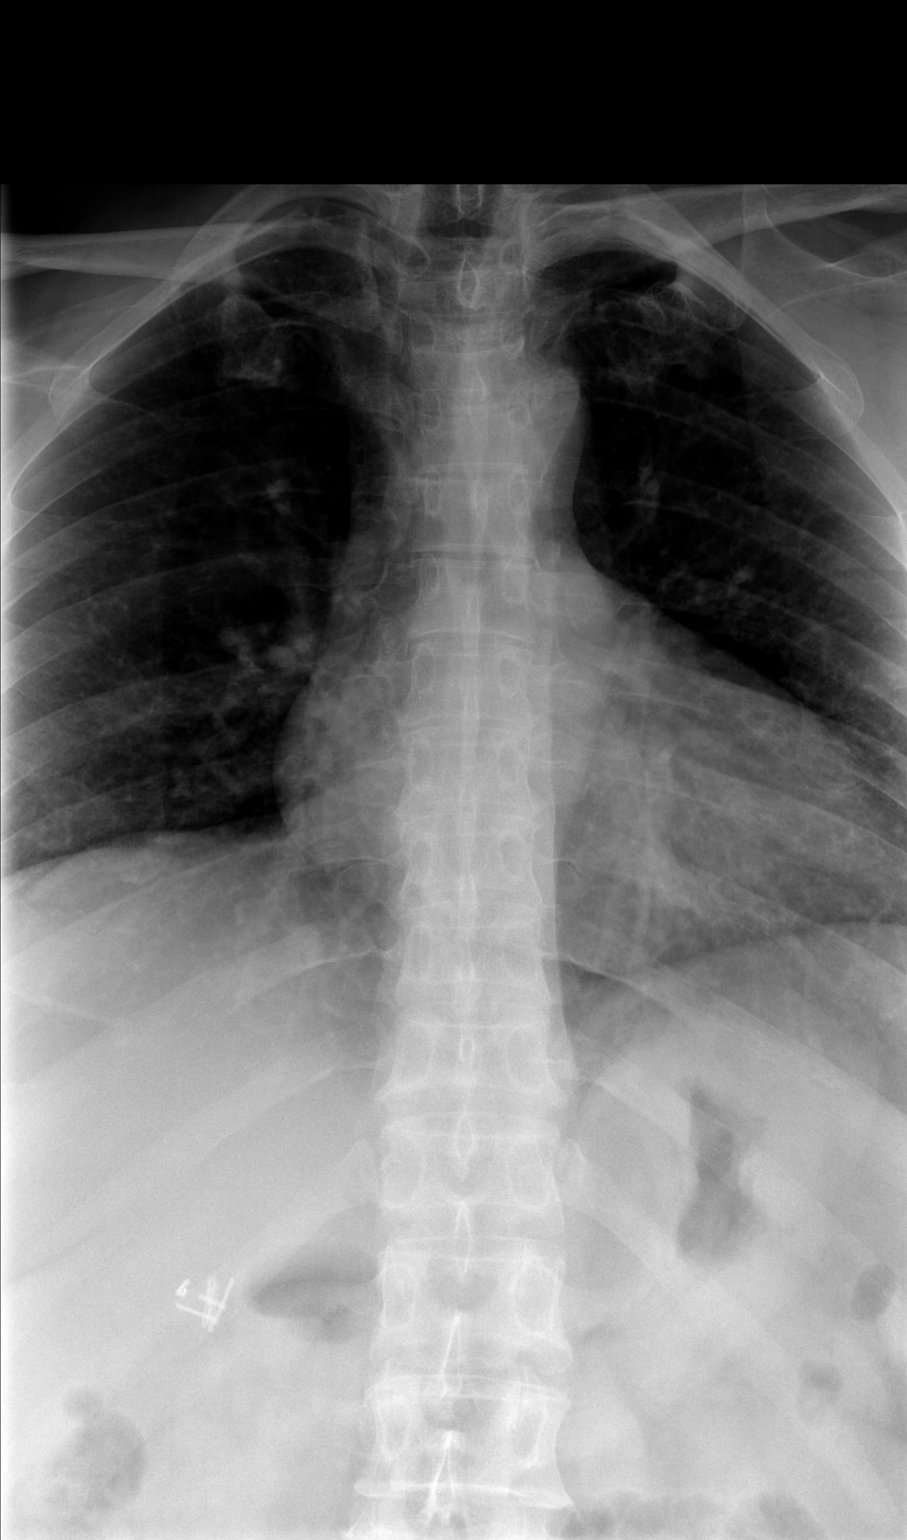

[w t-spine lat *]
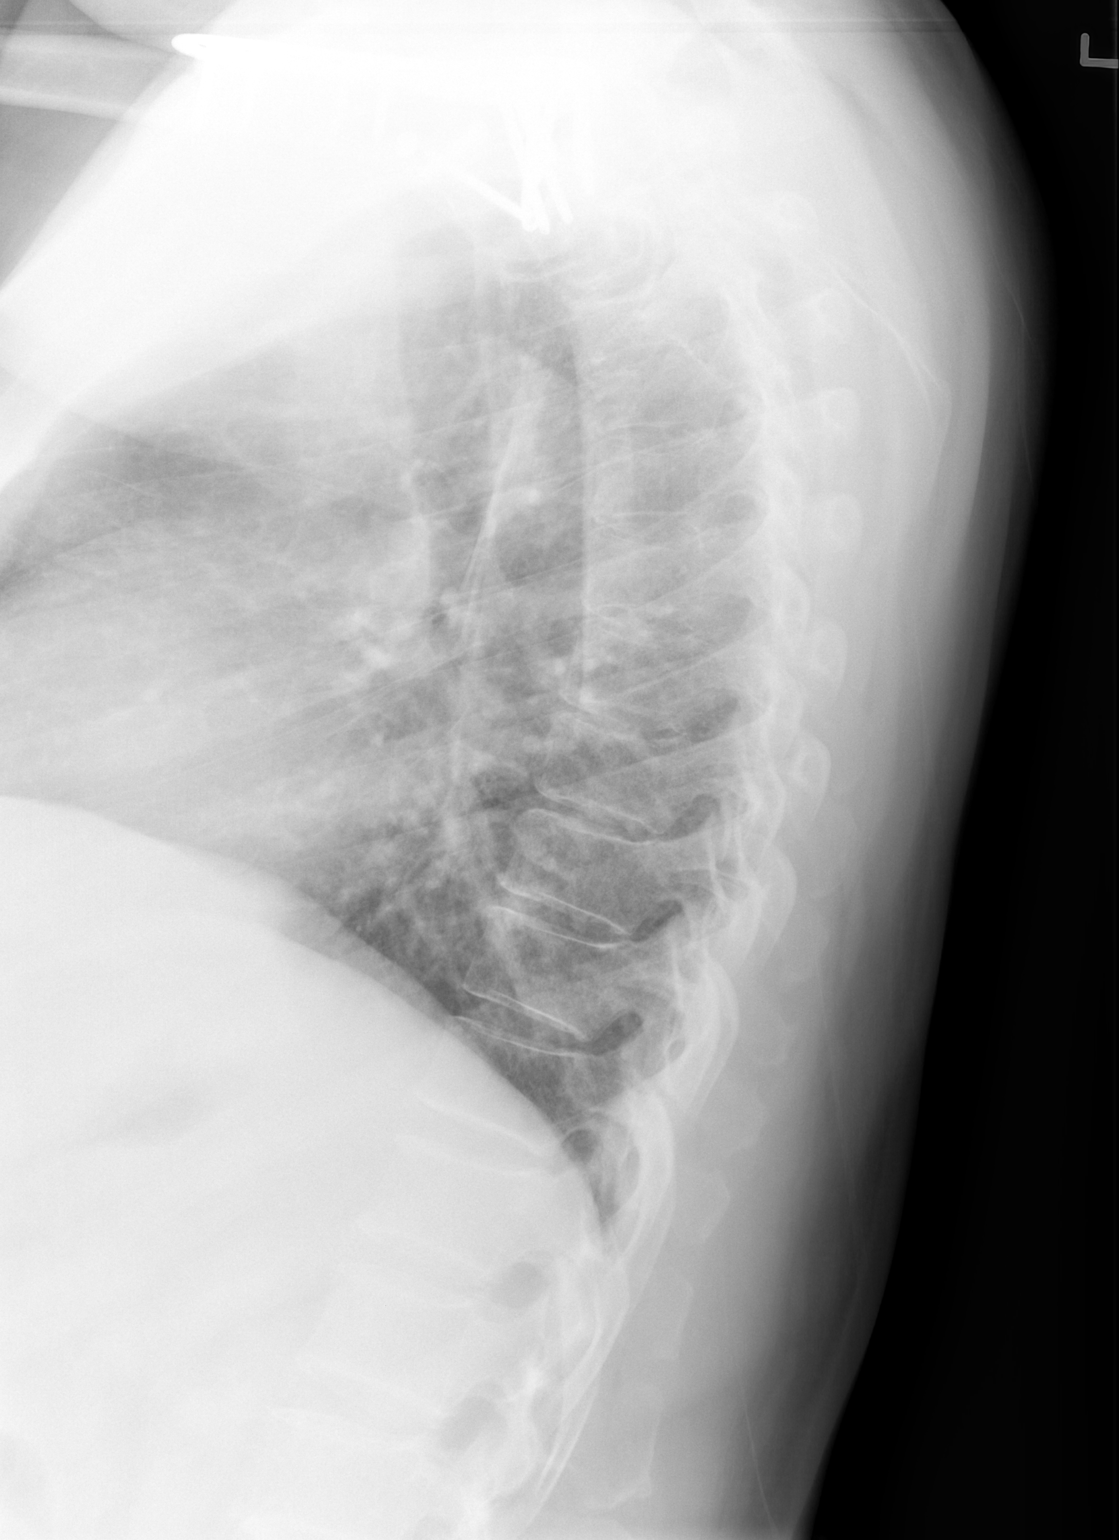

[w swimmers view]
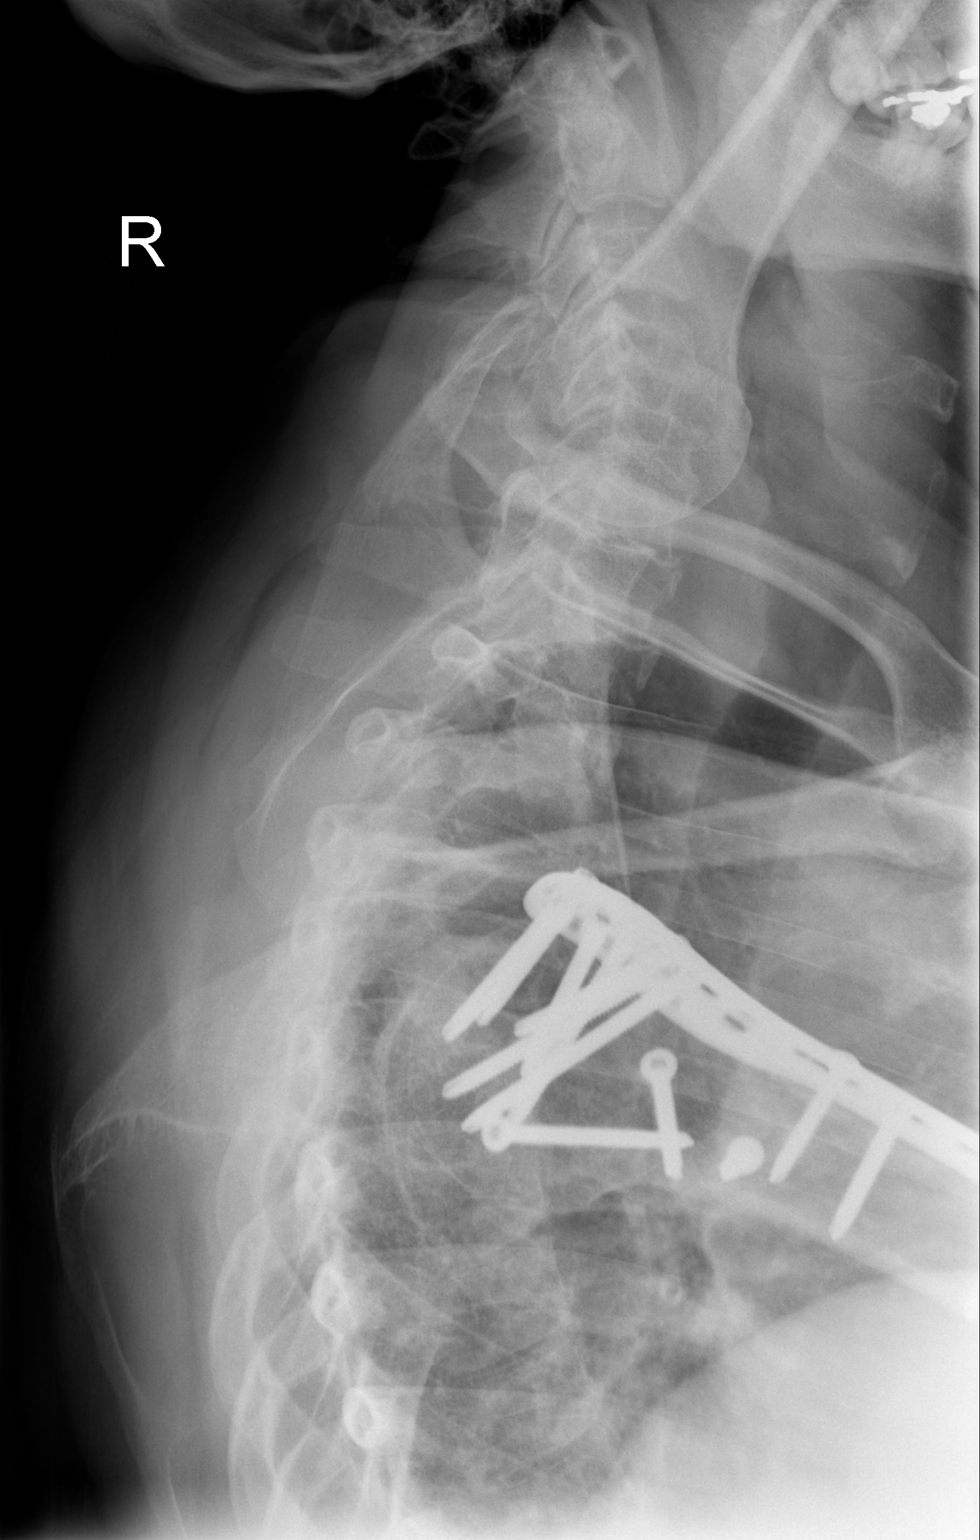

[3 of 3 positions shown; findings below may reference images not displayed]

FINDINGS: The lateral film demonstrates normal alignment of the
thoracic vertebral bodies.  Disc spaces and vertebral bodies are
maintained.  No acute bony findings, destructive bony changes or
abnormal paraspinal soft tissue swelling.  The visualized posterior
ribs appear normal.
IMPRESSION: Normal alignment and no acute bony findings.

## 2012-07-21 ENCOUNTER — Encounter: Payer: Self-pay | Admitting: Sports Medicine

## 2012-07-21 ENCOUNTER — Ambulatory Visit (INDEPENDENT_AMBULATORY_CARE_PROVIDER_SITE_OTHER): Payer: Self-pay | Admitting: Sports Medicine

## 2012-07-21 VITALS — BP 132/75 | HR 77 | Temp 98.0°F | Wt 195.0 lb

## 2012-07-21 DIAGNOSIS — R399 Unspecified symptoms and signs involving the genitourinary system: Secondary | ICD-10-CM | POA: Insufficient documentation

## 2012-07-21 DIAGNOSIS — M549 Dorsalgia, unspecified: Secondary | ICD-10-CM | POA: Insufficient documentation

## 2012-07-21 DIAGNOSIS — N529 Male erectile dysfunction, unspecified: Secondary | ICD-10-CM

## 2012-07-21 DIAGNOSIS — M542 Cervicalgia: Secondary | ICD-10-CM

## 2012-07-21 DIAGNOSIS — M25519 Pain in unspecified shoulder: Secondary | ICD-10-CM

## 2012-07-21 DIAGNOSIS — E039 Hypothyroidism, unspecified: Secondary | ICD-10-CM | POA: Insufficient documentation

## 2012-07-21 DIAGNOSIS — E785 Hyperlipidemia, unspecified: Secondary | ICD-10-CM | POA: Insufficient documentation

## 2012-07-21 DIAGNOSIS — G8929 Other chronic pain: Secondary | ICD-10-CM | POA: Insufficient documentation

## 2012-07-21 DIAGNOSIS — R3989 Other symptoms and signs involving the genitourinary system: Secondary | ICD-10-CM

## 2012-07-21 MED ORDER — TAMSULOSIN HCL 0.4 MG PO CAPS: 0.4000 mg | ORAL_CAPSULE | Freq: Every day | ORAL | Status: AC

## 2012-07-21 MED ORDER — CYCLOBENZAPRINE HCL 10 MG PO TABS: 5.0000 mg | ORAL_TABLET | Freq: Every evening | ORAL | Status: DC | PRN
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
## 2012-07-21 NOTE — Assessment & Plan Note (Signed)
TSH with lab draw and adjust as appropriate

## 2012-07-21 NOTE — Patient Instructions (Addendum)
It was nice to see you today.   Today we discussed: 1. Lower urinary tract symptoms (LUTS)  Please start - Tamsulosin HCl (FLOMAX) 0.4 MG CAPS; Take 1 capsule (0.4 mg total) by mouth daily.  Dispense: 30 capsule; Refill: 3  Please return to Clinic in the next 1-2 weeks to have your following labs checked:  Hypothyroidism  HLD (hyperlipidemia)   2. Chronic shoulder pain  Please start  - cyclobenzaprine (FLEXERIL) 10 MG tablet; Take 0.5-1 tablets (5-10 mg total) by mouth at bedtime as needed for muscle spasms.  Dispense: 30 tablet; Refill: 1  We will consider referring you to be evaluated for surgery once you have the orange card   Please follow up with Britta Mccreedy to qualify for the orange card  Please plan to return to see me in 1-2 months.  If you need anything prior to seeing me please call the clinic.  Please Bring all medications with you to each appointment.
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
## 2012-07-21 NOTE — Assessment & Plan Note (Addendum)
FLP ordered, BMET for risk stratification >elevated in past, consider Statin

## 2012-07-21 NOTE — Assessment & Plan Note (Signed)
Likely BPH associated Start Flomax  Check PSA, normal previously

## 2012-07-23 ENCOUNTER — Encounter: Payer: Self-pay | Admitting: Sports Medicine

## 2012-07-24 ENCOUNTER — Other Ambulatory Visit: Payer: Self-pay

## 2012-07-24 DIAGNOSIS — R399 Unspecified symptoms and signs involving the genitourinary system: Secondary | ICD-10-CM

## 2012-07-24 DIAGNOSIS — E039 Hypothyroidism, unspecified: Secondary | ICD-10-CM

## 2012-07-24 DIAGNOSIS — E785 Hyperlipidemia, unspecified: Secondary | ICD-10-CM

## 2012-07-24 LAB — LIPID PANEL
Cholesterol: 257 mg/dL — ABNORMAL HIGH (ref 0–200)
HDL: 41 mg/dL (ref >39)
LDL Cholesterol: 187 mg/dL — ABNORMAL HIGH (ref 0–99)
Triglycerides: 145 mg/dL (ref <150)

## 2012-07-24 LAB — TSH: TSH: 9.665 u[IU]/mL — ABNORMAL HIGH (ref 0.350–4.500)

## 2012-07-24 LAB — BASIC METABOLIC PANEL
Creat: 0.67 mg/dL (ref 0.50–1.35)
Sodium: 139 mEq/L (ref 135–145)

## 2012-07-24 NOTE — Progress Notes (Signed)
BMP,TSH,FLP AND PSA DONE TODAY Nicholas Barton

## 2012-08-04 ENCOUNTER — Telehealth: Payer: Self-pay | Admitting: Sports Medicine

## 2012-08-04 ENCOUNTER — Encounter: Payer: Self-pay | Admitting: Sports Medicine

## 2012-08-04 DIAGNOSIS — E039 Hypothyroidism, unspecified: Secondary | ICD-10-CM

## 2012-08-04 DIAGNOSIS — E785 Hyperlipidemia, unspecified: Secondary | ICD-10-CM

## 2012-08-04 MED ORDER — LEVOTHYROXINE SODIUM 175 MCG PO TABS: 175.0000 ug | ORAL_TABLET | Freq: Every day | ORAL | Status: DC

## 2012-08-04 NOTE — Assessment & Plan Note (Signed)
Increased synthroid dose today Ensured compliance

## 2012-08-04 NOTE — Assessment & Plan Note (Signed)
Pt to schedule office visit to discuss cholesterol levels

## 2012-08-04 NOTE — Telephone Encounter (Signed)
Called and used [facility] interperters to discuss lab results. Pt to follow up in 1-2 weeks; may not be able to make appointment due to starting new job .but told to cancel if unable to make it once scheduled

## 2012-08-12 ENCOUNTER — Ambulatory Visit: Payer: Self-pay | Admitting: Sports Medicine

## 2013-06-01 ENCOUNTER — Telehealth: Payer: Self-pay | Admitting: Sports Medicine

## 2013-06-01 NOTE — Telephone Encounter (Signed)
Needs refill on lipitor. [facility] on [address]

## 2013-06-01 NOTE — Telephone Encounter (Signed)
Patient has never been on Lipitor.  He has not shown up for almost a year.  Please have him come in for a visit to discuss any medications

## 2013-06-08 ENCOUNTER — Other Ambulatory Visit: Payer: Self-pay | Admitting: Sports Medicine

## 2013-06-08 DIAGNOSIS — E039 Hypothyroidism, unspecified: Secondary | ICD-10-CM

## 2013-07-05 ENCOUNTER — Telehealth: Payer: Self-pay | Admitting: Sports Medicine

## 2013-07-05 MED ORDER — LEVOTHYROXINE SODIUM 50 MCG PO TABS: 50.0000 ug | ORAL_TABLET | Freq: Every day | ORAL | Status: DC

## 2013-07-05 NOTE — Telephone Encounter (Signed)
Call on outside line: Need of Synthroid.  90 tablets of pill given in past 12 months.  Given wanting to avoid myxedema crisis or thyrotoxicosis will provided altered regimen.  dose will be titrated up over 2 weeks; then dose for additional 2 weeks; Rx for 90 tablets given.  No further refills until seen.  Needs VISIT in 3-4 weeks.  1 tablet daily for 2 weeks; then 2 tablets daily

## 2013-07-06 ENCOUNTER — Other Ambulatory Visit: Payer: Self-pay

## 2013-07-06 DIAGNOSIS — E039 Hypothyroidism, unspecified: Secondary | ICD-10-CM

## 2013-07-06 NOTE — Telephone Encounter (Signed)
Called pt and LVM hopefully pt will call us back. ° °Marines   °

## 2013-07-06 NOTE — Progress Notes (Signed)
TSH DONE TODAY Cameran Pettey 

## 2013-07-12 ENCOUNTER — Telehealth: Payer: Self-pay | Admitting: Sports Medicine

## 2013-07-12 MED ORDER — LEVOTHYROXINE SODIUM 150 MCG PO TABS
150.0000 ug | ORAL_TABLET | Freq: Every day | ORAL | Status: DC
Start: 2013-07-12 — End: 2013-09-21

## 2013-07-12 NOTE — Telephone Encounter (Signed)
Change to 146mcg due to Level being too high. Has been on but from out of town provider Will give 30 day supply; NO FURTHER REFILLS UNTIL SEEN

## 2013-07-13 NOTE — Telephone Encounter (Signed)
Called pt and notify about refill pt is aware that need to come to see his pcp before get another refill. Pt refused to come to see his pcp. Pt doesn't have ins.  [place]

## 2013-08-25 NOTE — Telephone Encounter (Signed)
Received refill request for Synthroid again. As previously indicated we are unable to provide any further Rx until seen in our clinic by a provider. Refill request denied.

## 2013-09-08 ENCOUNTER — Other Ambulatory Visit: Payer: Self-pay | Admitting: *Deleted

## 2013-09-14 ENCOUNTER — Telehealth: Payer: Self-pay | Admitting: Sports Medicine

## 2013-09-14 NOTE — Telephone Encounter (Signed)
This has been addressed multiple times in prior telephone notes.  He must be seen by a provider to have any further refills on his medications.  This is non-negotiable and has been discussed with preceptors and Tomasa Hosteller on multiple prior occasions.

## 2013-09-14 NOTE — Telephone Encounter (Signed)
Patient was unable to understand what I was trying to explain after 25 min on the phone son walked in.I spoke to [employer] voiced understanding.advised to schedule appointment with translator.Alexandr Yaworski, Lewie Loron'

## 2013-09-14 NOTE — Telephone Encounter (Signed)
Patient needs refill of Levothyroxin mg called in to [facility] on [address].  He is out of medication and really needs it.  He has no insurance and is not working and cannot afford to come in for a visit.

## 2013-09-17 ENCOUNTER — Telehealth: Payer: Self-pay | Admitting: *Deleted

## 2013-09-17 NOTE — Telephone Encounter (Signed)
Patient has called back again to request refill on his Levothyroxine on the Hispanic line. Per Dr. Paulla Fore patient is not to have a refill until he is seen for an office visit.

## 2013-09-20 ENCOUNTER — Telehealth: Payer: Self-pay | Admitting: Sports Medicine

## 2013-09-20 NOTE — Telephone Encounter (Signed)
Pt came into office stating that he needs refill on medication Levothyroxin.

## 2013-09-20 NOTE — Telephone Encounter (Signed)
Spoke with interpreter Flavia Shipper was going to call him.Nicholas Barton, Nicholas Barton

## 2013-09-20 NOTE — Telephone Encounter (Signed)
Marines can you please call this patient,refuses to talk to a non spanish speaker.Patients need an appointment to be seen otherwise RX will be denied.We have spoken with son,he relayed message refuses to understand.Thank you.Airen Stiehl, Lewie Loron

## 2013-09-21 ENCOUNTER — Encounter: Payer: Self-pay | Admitting: Family Medicine

## 2013-09-21 ENCOUNTER — Ambulatory Visit (INDEPENDENT_AMBULATORY_CARE_PROVIDER_SITE_OTHER): Payer: Self-pay | Admitting: Family Medicine

## 2013-09-21 VITALS — BP 137/89 | HR 70 | Temp 97.8°F | Wt 193.0 lb

## 2013-09-21 DIAGNOSIS — E039 Hypothyroidism, unspecified: Secondary | ICD-10-CM

## 2013-09-21 LAB — COMPREHENSIVE METABOLIC PANEL
ALBUMIN: 4.1 g/dL (ref 3.5–5.2)
ALK PHOS: 127 U/L — AB (ref 39–117)
ALT: 59 U/L — ABNORMAL HIGH (ref 0–53)
AST: 32 U/L (ref 0–37)
BUN: 19 mg/dL (ref 6–23)
CALCIUM: 8.9 mg/dL (ref 8.4–10.5)
CHLORIDE: 105 meq/L (ref 96–112)
CO2: 26 mEq/L (ref 19–32)
Creat: 0.65 mg/dL (ref 0.50–1.35)
Glucose, Bld: 127 mg/dL — ABNORMAL HIGH (ref 70–99)
POTASSIUM: 4 meq/L (ref 3.5–5.3)
Sodium: 137 mEq/L (ref 135–145)
TOTAL PROTEIN: 6.8 g/dL (ref 6.0–8.3)
Total Bilirubin: 0.8 mg/dL (ref 0.2–1.2)

## 2013-09-21 LAB — CBC
HEMATOCRIT: 44.5 % (ref 39.0–52.0)
Hemoglobin: 15.1 g/dL (ref 13.0–17.0)
MCH: 30.2 pg (ref 26.0–34.0)
MCHC: 33.9 g/dL (ref 30.0–36.0)
MCV: 89 fL (ref 78.0–100.0)
PLATELETS: 387 10*3/uL (ref 150–400)
RBC: 5 MIL/uL (ref 4.22–5.81)
RDW: 13.7 % (ref 11.5–15.5)
WBC: 6.4 10*3/uL (ref 4.0–10.5)

## 2013-09-21 LAB — TSH: TSH: 2.115 u[IU]/mL (ref 0.350–4.500)

## 2013-09-21 MED ORDER — LEVOTHYROXINE SODIUM 75 MCG PO TABS: 75.0000 ug | ORAL_TABLET | Freq: Every day | ORAL | Status: DC

## 2013-09-21 NOTE — Patient Instructions (Signed)
Come back to see Dr. Paulla Fore on April 6.   We will see if we can get an interpreter for when you meet with Pamala Hurry.    I have refilled your medicines today.    Good luck with finding a job!

## 2013-09-21 NOTE — Progress Notes (Signed)
Subjective:    Nicholas Barton is a 58 y.o. male who presents to [facility] today for thyroid medication refill:  1.  Thyroid issues:  Has been on thyroid supplementation "for years."  Currently endorses taking only 2 tabs daily of medication, brings bottle in which is 50 mcg pill.  Adamant this is his medication regimen and that it has not changed.  Has been taking regularly for past several months.  Needs refill before tomorrow.  See below for ROS.  Increased stress at home.  Has been out of work since December with resultant financial difficulties.   Also with increased RIght knee pain, lower back pain, and "generalized aches" worse since being out of work.  Believes this is secondary to being out of work and decreased activity.    May be taking a job in [place], only work he can find.  Family would remain here and he would travel back and forth  Entire exam conducted with phone interpreter present.    The following portions of the patient's history were reviewed and updated as appropriate: allergies, current medications, past medical history, family and social history, and problem list. Patient is a nonsmoker.    PMH reviewed.  Past Medical History  Diagnosis Date  . Hypothyroidism   . HLD (hyperlipidemia)   . ED (erectile dysfunction)   . Eczema   . Onychomycosis   . Chronic neck pain   . Chronic shoulder pain   . Chronic back pain   . Lower urinary tract symptoms (LUTS)    Past Surgical History  Procedure Laterality Date  . Cholecystectomy, laparoscopic  1999  . Hernia repair  2000  . Shoulder surgery  2007  . Thyroid surgery  2005    radioablation  . Neck surgery  2007    ?with shoulder surgery    Medications reviewed. Current Outpatient Prescriptions  Medication Sig Dispense Refill  . cyclobenzaprine (FLEXERIL) 10 MG tablet Take 0.5-1 tablets (5-10 mg total) by mouth at bedtime as needed for muscle spasms.  30 tablet  1  . ibuprofen (ADVIL,MOTRIN) 200 MG tablet Take 200  mg by mouth every 6 (six) hours as needed.        Marland Kitchen levothyroxine (SYNTHROID, LEVOTHROID) 150 MCG tablet Take 1 tablet (150 mcg total) by mouth daily before breakfast.  30 tablet  0  . Tamsulosin HCl (FLOMAX) 0.4 MG CAPS Take 1 capsule (0.4 mg total) by mouth daily.  30 capsule  3   No current facility-administered medications for this visit.     Objective:   Physical Exam BP 137/89  Pulse 70  Temp(Src) 97.8 F (36.6 C) (Oral)  Wt 193 lb (87.544 kg) Gen:  Alert, cooperative patient who appears stated age in no acute distress.  Vital signs reviewed. Neck:  No thyromegaly Heart: RRR Ext:  No edema   No results found for this or any previous visit (from the past 72 hour(s)).

## 2013-09-21 NOTE — Telephone Encounter (Signed)
Pt came 09/21/2013 and have lab.   [place]

## 2013-09-22 NOTE — Assessment & Plan Note (Signed)
Refilled 1 month's worth of medication TSH today -- states he's been taking regularly 100 mcg daily (2 tabs of 50 mcg) Unable to fill this in Epic.  Sent in 75 mcg with room to increase/decrease based on TSH.   His main concern today was financial stress, including how to pay for our visit and lab draw today.  Was able to meet with Nicholas Barton [place] immediately after our visit.  He was previously declined the [employer] secondary to not having all the proper paperwork.

## 2013-09-27 ENCOUNTER — Ambulatory Visit: Payer: Self-pay

## 2013-10-11 ENCOUNTER — Ambulatory Visit: Payer: Self-pay | Admitting: Sports Medicine

## 2013-10-13 NOTE — Telephone Encounter (Signed)
Pt  Called to requested refill for thyroid medicine.  [place]

## 2013-10-18 MED ORDER — LEVOTHYROXINE SODIUM 100 MCG PO TABS: 150.0000 ug | ORAL_TABLET | Freq: Every day | ORAL | Status: AC

## 2013-10-18 NOTE — Telephone Encounter (Signed)
Seen by Dr. Mingo Amber last month.  Will refill for 150mcg tablets as it looks like this was the dose he reported taking.  TSH therapeutic @ 137mcg.  If this isn't correct pt needs f/u appointment with ME to discuss this.  Otherwise needs f/u appointment in 3 months to meet new Dr.

## 2014-11-04 ENCOUNTER — Other Ambulatory Visit (HOSPITAL_COMMUNITY): Payer: Self-pay | Admitting: Primary Care

## 2014-11-04 ENCOUNTER — Ambulatory Visit (HOSPITAL_COMMUNITY)
Admission: RE | Admit: 2014-11-04 | Discharge: 2014-11-04 | Disposition: A | Discharge status: Home / Self Care | Payer: Self-pay | Source: Ambulatory Visit | Attending: Primary Care | Admitting: Primary Care

## 2014-11-04 DIAGNOSIS — R0600 Dyspnea, unspecified: Secondary | ICD-10-CM | POA: Insufficient documentation

## 2014-11-04 DIAGNOSIS — R0689 Other abnormalities of breathing: Secondary | ICD-10-CM

## 2015-05-19 ENCOUNTER — Emergency Department (HOSPITAL_COMMUNITY)
Admission: EM | Admit: 2015-05-19 | Discharge: 2015-05-19 | Disposition: A | Discharge status: Home / Self Care | Payer: Self-pay | Attending: Emergency Medicine | Admitting: Emergency Medicine

## 2015-05-19 ENCOUNTER — Encounter (HOSPITAL_COMMUNITY): Payer: Self-pay | Admitting: Family Medicine

## 2015-05-19 DIAGNOSIS — Z872 Personal history of diseases of the skin and subcutaneous tissue: Secondary | ICD-10-CM | POA: Insufficient documentation

## 2015-05-19 DIAGNOSIS — E039 Hypothyroidism, unspecified: Secondary | ICD-10-CM | POA: Insufficient documentation

## 2015-05-19 DIAGNOSIS — G8929 Other chronic pain: Secondary | ICD-10-CM | POA: Insufficient documentation

## 2015-05-19 DIAGNOSIS — Z79899 Other long term (current) drug therapy: Secondary | ICD-10-CM | POA: Insufficient documentation

## 2015-05-19 DIAGNOSIS — E785 Hyperlipidemia, unspecified: Secondary | ICD-10-CM | POA: Insufficient documentation

## 2015-05-19 DIAGNOSIS — R739 Hyperglycemia, unspecified: Secondary | ICD-10-CM | POA: Insufficient documentation

## 2015-05-19 DIAGNOSIS — Z880 Allergy status to penicillin: Secondary | ICD-10-CM | POA: Insufficient documentation

## 2015-05-19 LAB — URINALYSIS, ROUTINE W REFLEX MICROSCOPIC
Bilirubin Urine: NEGATIVE
Glucose, UA: >1000 mg/dL — AB
Hgb urine dipstick: NEGATIVE
KETONES UR: NEGATIVE mg/dL
LEUKOCYTES UA: NEGATIVE
NITRITE: NEGATIVE
PH: 5 (ref 5.0–8.0)
PROTEIN: NEGATIVE mg/dL
Specific Gravity, Urine: 1.035 — ABNORMAL HIGH (ref 1.005–1.030)
UROBILINOGEN UA: 0.2 mg/dL (ref 0.0–1.0)

## 2015-05-19 LAB — I-STAT CHEM 8, ED
BUN: 22 mg/dL — ABNORMAL HIGH (ref 6–20)
CALCIUM ION: 1.17 mmol/L (ref 1.12–1.23)
Chloride: 95 mmol/L — ABNORMAL LOW (ref 101–111)
Creatinine, Ser: 0.9 mg/dL (ref 0.61–1.24)
GLUCOSE: 558 mg/dL — AB (ref 65–99)
HCT: 45 % (ref 39.0–52.0)
HEMOGLOBIN: 15.3 g/dL (ref 13.0–17.0)
POTASSIUM: 4.1 mmol/L (ref 3.5–5.1)
SODIUM: 131 mmol/L — AB (ref 135–145)
TCO2: 25 mmol/L (ref 0–100)

## 2015-05-19 LAB — CBC
HCT: 45 % (ref 39.0–52.0)
Hemoglobin: 16.5 g/dL (ref 13.0–17.0)
MCH: 31.1 pg (ref 26.0–34.0)
MCHC: 36.7 g/dL — ABNORMAL HIGH (ref 30.0–36.0)
MCV: 84.9 fL (ref 78.0–100.0)
PLATELETS: 285 10*3/uL (ref 150–400)
RBC: 5.3 MIL/uL (ref 4.22–5.81)
RDW: 12.1 % (ref 11.5–15.5)
WBC: 9.8 10*3/uL (ref 4.0–10.5)

## 2015-05-19 LAB — BASIC METABOLIC PANEL
Anion gap: 14 (ref 5–15)
BUN: 21 mg/dL — ABNORMAL HIGH (ref 6–20)
CHLORIDE: 90 mmol/L — AB (ref 101–111)
CO2: 21 mmol/L — ABNORMAL LOW (ref 22–32)
CREATININE: 1.15 mg/dL (ref 0.61–1.24)
Calcium: 10.5 mg/dL — ABNORMAL HIGH (ref 8.9–10.3)
GFR calc non Af Amer: >60 mL/min (ref >60)
Glucose, Bld: 782 mg/dL (ref 65–99)
POTASSIUM: 4.9 mmol/L (ref 3.5–5.1)
SODIUM: 125 mmol/L — AB (ref 135–145)

## 2015-05-19 LAB — I-STAT VENOUS BLOOD GAS, ED
ACID-BASE EXCESS: 1 mmol/L (ref 0.0–2.0)
Bicarbonate: 27.6 mEq/L — ABNORMAL HIGH (ref 20.0–24.0)
O2 SAT: 26 %
PH VEN: 7.37 — AB (ref 7.250–7.300)
TCO2: 29 mmol/L (ref 0–100)
pCO2, Ven: 47.8 mmHg (ref 45.0–50.0)
pO2, Ven: 19 mmHg — CL (ref 30.0–45.0)

## 2015-05-19 LAB — URINE MICROSCOPIC-ADD ON

## 2015-05-19 LAB — CBG MONITORING, ED
GLUCOSE-CAPILLARY: 494 mg/dL — AB (ref 65–99)
Glucose-Capillary: 425 mg/dL — ABNORMAL HIGH (ref 65–99)

## 2015-05-19 MED ORDER — SODIUM CHLORIDE 0.9 % IV BOLUS (SEPSIS)
1000.0000 mL | Freq: Once | INTRAVENOUS | Status: AC
Start: 2015-05-19 — End: 2015-05-19
  Administered 2015-05-19: 1000 mL via INTRAVENOUS

## 2015-05-19 MED ORDER — ACCU-CHEK MULTICLIX LANCETS MISC: Status: AC

## 2015-05-19 MED ORDER — KETAMINE HCL 10 MG/ML IJ SOLN: INTRAMUSCULAR | Status: AC | PRN

## 2015-05-19 MED ORDER — SODIUM CHLORIDE 0.9 % IV BOLUS (SEPSIS)
1000.0000 mL | Freq: Once | INTRAVENOUS | Status: AC
  Administered 2015-05-19: 1000 mL via INTRAVENOUS

## 2015-05-19 MED ORDER — GLUCOSE BLOOD VI STRP: ORAL_STRIP | Status: AC

## 2015-05-19 MED ORDER — SODIUM CHLORIDE 0.9 % IV BOLUS (SEPSIS)
2000.0000 mL | Freq: Once | INTRAVENOUS | Status: AC
Start: 2015-05-19 — End: 2015-05-19
  Administered 2015-05-19: 2000 mL via INTRAVENOUS

## 2015-05-19 NOTE — ED Notes (Signed)
Pt here for hyperglycemia. Sent here by his doctor CBG >600. sts he has been taking his meds but was recently outside if the country and was eating bad. sts weakness and thirsty.

## 2015-05-19 NOTE — ED Provider Notes (Signed)
Arrival Date & Time: 05/19/15 & 1707 History   Chief Complaint  Patient presents with  . Hyperglycemia   HPI Caysen Trella is a 59 y.o. male who presents for concerns of elevated blood glucose for the past week. Patient states he was sent by Dr. Due to elevated blood sugars above 600. Patient endorses blood sugars exacerbated by recent trip to outside the country causing him to feet from a normal diet. Patient is having generalized weakness endorses no focal neurologic abnormalities or focal weakness. Patient states he has been drinking much more fluid than usual. Patient also urinating much more frequently than usual without dysuria. No blood per urine. Patient denies emesis no abdominal pain no back pain no flank pain no fevers chills or neck pain or headache or concern for altered mental status. Patient denies shortness of breath or chest pain.  Past Medical History  I reviewed & agree with nursing's documentation of PMHx, PSHx, SHx & FHx. Past Medical History  Diagnosis Date  . Hypothyroidism   . HLD (hyperlipidemia)   . ED (erectile dysfunction)   . Eczema   . Onychomycosis   . Chronic neck pain   . Chronic shoulder pain   . Chronic back pain   . Lower urinary tract symptoms (LUTS)    Past Surgical History  Procedure Laterality Date  . Cholecystectomy, laparoscopic  1999  . Hernia repair  2000  . Shoulder surgery  2007  . Thyroid surgery  2005    radioablation  . Neck surgery  2007    ?with shoulder surgery   Social History   Social History  . Marital Status: Legally Separated    Spouse Name: N/A  . Number of Children: N/A  . Years of Education: N/A   Social History Main Topics  . Smoking status: Never Smoker   . Smokeless tobacco: None  . Alcohol Use: No  . Drug Use: No  . Sexual Activity: Not Asked   Other Topics Concern  . None   Social History Narrative   [place]   Separated; 3 children in [place]   Unemployed, prior worked in Office manager             Family History  Problem Relation Age of Onset  . Hypertension Mother   . Hypertension Father   . Heart attack Neg Hx   . Diabetes Neg Hx   . Alcohol abuse Maternal Grandfather     Review of Systems   Complete Review of Systems obtained and is negative except as stated in HPI.  Allergies  Penicillins  Home Medications   Prior to Admission medications   Medication Sig Start Date End Date Taking? Authorizing Provider  Cholecalciferol (VITAMIN D) 2000 UNITS CAPS Take 1 capsule by mouth daily.   Yes Historical Provider, MD  glipiZIDE (GLUCOTROL) 5 MG tablet Take 2.5 mg by mouth daily before breakfast.   Yes Historical Provider, MD  hydrochlorothiazide (HYDRODIURIL) 25 MG tablet Take 25 mg by mouth daily.   Yes Historical Provider, MD  levothyroxine (SYNTHROID, LEVOTHROID) 125 MCG tablet Take 125 mcg by mouth daily before breakfast.   Yes Historical Provider, MD  metFORMIN (GLUCOPHAGE) 1000 MG tablet Take 1,000 mg by mouth 2 (two) times daily with a meal.   Yes Historical Provider, MD  cyclobenzaprine (FLEXERIL) 10 MG tablet Take 0.5-1 tablets (5-10 mg total) by mouth at bedtime as needed for muscle spasms. Patient not taking: Reported on 05/19/2015 07/21/12   Gerda Diss, MD  glucose blood test  strip Use as instructed 05/19/15   Deno Etienne, DO  ibuprofen (ADVIL,MOTRIN) 200 MG tablet Take 200 mg by mouth every 6 (six) hours as needed.      Historical Provider, MD  Lancets (ACCU-CHEK MULTICLIX) lancets Use as instructed 05/19/15   Voncille Lo, MD  levothyroxine (SYNTHROID, LEVOTHROID) 100 MCG tablet Take 1.5 tablets (150 mcg total) by mouth daily before breakfast. Patient not taking: Reported on 05/19/2015 10/18/13   Gerda Diss, MD  Tamsulosin HCl (FLOMAX) 0.4 MG CAPS Take 1 capsule (0.4 mg total) by mouth daily. Patient not taking: Reported on 05/19/2015 07/21/12   Gerda Diss, MD    Physical Exam  BP 134/87 mmHg  Pulse 77  Temp(Src) 97.7 F (36.5 C) (Oral)  Resp  23  Ht 5\' 6"  (1.676 m)  Wt 180 lb 8 oz (81.874 kg)  BMI 29.15 kg/m2  SpO2 96% Physical Exam Vitals & Nursing notes reviewed. CONST: male, in NAD. Appears WD/WN & stated age. HEAD: [place]/AT. EYES: PERRL. No conjunctival injection & lids symmetrical. ENMT: External nose & ears atraumatic. MM dry. Oropharynx w/o swelling or exudates. NECK: Supple, w/o meningismus. Trachea midline. JVD & stridor absent. CVS: S1/S2 audible w/o gallops or obvious murmurs.  Peripheral pulses 2+ & equal in all extremities. Cap refill < 2 seconds. RESP: Respiratory effort normal. CTAB w/o wheeze. GI: NT/ND, w/o rebound or guarding. BS audible. MSK: Extremities w/o ttp, deformity or cyanosis. Joints w/o swelling. SKIN: Warm & dry. No rash. W/o open wound. NEURO: CN II-XIII grossly intact. Sensation & Strength w/o deficit. W/o clonus. PSYCH: Mood & affect appropriate. Cooperative.  ED Course  Procedures  Labs Review Labs Reviewed  BASIC METABOLIC PANEL - Abnormal; Notable for the following:    Sodium 125 (*)    Chloride 90 (*)    CO2 21 (*)    Glucose, Bld 782 (*)    BUN 21 (*)    Calcium 10.5 (*)    All other components within normal limits  CBC - Abnormal; Notable for the following:    MCHC 36.7 (*)    All other components within normal limits  URINALYSIS, ROUTINE W REFLEX MICROSCOPIC (NOT AT [facility]) - Abnormal; Notable for the following:    Specific Gravity, Urine 1.035 (*)    Glucose, UA >1000 (*)    All other components within normal limits  CBG MONITORING, ED - Abnormal; Notable for the following:    Glucose-Capillary >600 (*)    All other components within normal limits  CBG MONITORING, ED - Abnormal; Notable for the following:    Glucose-Capillary 494 (*)    All other components within normal limits  I-STAT CHEM 8, ED - Abnormal; Notable for the following:    Sodium 131 (*)    Chloride 95 (*)    BUN 22 (*)    Glucose, Bld 558 (*)    All other components within normal limits  I-STAT VENOUS  BLOOD GAS, ED - Abnormal; Notable for the following:    pH, Ven 7.370 (*)    pO2, Ven 19.0 (*)    Bicarbonate 27.6 (*)    All other components within normal limits  CBG MONITORING, ED - Abnormal; Notable for the following:    Glucose-Capillary 425 (*)    All other components within normal limits  URINE MICROSCOPIC-ADD ON    Imaging Review No results found.  Laboratory and Imaging results were personally reviewed by myself and used in the medical decision making of this patient's treatment and  disposition.  EKG Interpretation  EKG Interpretation  Date/Time:  Friday May 19 2015 19:01:23 EST Ventricular Rate:  81 PR Interval:  169 QRS Duration: 141 QT Interval:  398 QTC Calculation: 462 R Axis:   -26 Text Interpretation:  Sinus rhythm Right bundle branch block Baseline wander in lead(s) V2 No old tracing to compare Confirmed by FLOYD MD, DANIEL [phone]) on 05/19/2015 8:22:54 PM      MDM  Keontre Cloutier is a 59 y.o. male with H&P as above. ED clinical course as follows:  Patient is well-appearing without acute distress and vital signs appropriate without concern for hypertension or tachycardia and patient without altered mental status. Patient is a known type II diabetic patient recently placed on metformin twice a day 1000 mg however patient has been with elevated blood sugars for the past 2-3 days and patient is without evidence of infection or other metabolic emergency or concern that would lead to elevated blood sugars this time. Given H&P without red flags or concerning the school exam findings patient presentation consistent with poor diet compliance. Primary care physician called in recent dose of glipizide 5 mg. Patient in the emergency department hemodynamically stable.  Initial blood glucose unable to be obtained due to number above 600 on capillary blood glucose. BMP reveals glucose of 782. Patient has no AKI and per other metabolic findings consistent with severe  dehydration and hyponatremia. patient has no concerning findings on CBC. UA reveals elevated glucose however no evidence of infection. Patient's ECG reveals no concerning ischemic changes and a sinus rhythm with right bundle branch block morphology. Do not suspect ACS at this time. Patient has been without chest pain or shortness of breath blood gas venous obtained revealing no remarkable concerns and patient given several intravenous fluid boluses totaling 3000 mL resulting in improvement of hyponatremia and hyper glycemia. Prior to discharge patient's sodium 131 and blood glucose maintained at 425. Patient is well-appearing with no concerns or questions states understanding of return precautions and patient will follow up with primary care physician within the next 1-3 days. Patient provided Accu-Chek materials for continued blood glucose monitoring at home and instructed that he is to call primary care physician's office in a.m. With first glucose check of day without fail.  Disposition: Discharge.  Clinical Impression:  1. Hyperglycemia    Patient care discussed with Dr. Tyrone Nine, who oversaw their evaluation & treatment & voiced agreement. House Officer: Voncille Lo, MD, Emergency Medicine.  Voncille Lo, MD 05/25/15 [place], DO 05/25/15 1002

## 2015-05-19 NOTE — Discharge Instructions (Signed)
Perfil metablico bsico (Basic Metabolic Panel) POR QU ME DEBO REALIZAR ESTA PRUEBA? El perfil metablico bsico mide los niveles de las siguientes sustancias en la sangre:   Glucosa. La glucosa es un azcar simple que sirve como principal fuente de energa del organismo.  Creatinina. La creatinina es un producto de desecho de la actividad muscular normal. Los riones se encargan de eliminarla del cuerpo.  Nitrgeno ureico en sangre (BUN). El nitrgeno ureico es un producto de desecho de la degradacin de las protenas. Se produce cuando hay un exceso de protenas en el cuerpo que se degradan y se usan para la energa. Los riones se Therapist, sports.  Electrolitos. Los electrolitos son partculas de carga positiva o negativa que se disuelven en el agua de diferentes compartimientos corporales, por ejemplo, el suero sanguneo, el agua dentro de las clulas y el agua fuera de ellas. Las Production designer, theatre/television/film de Brewing technologist varan entre los diferentes compartimientos de lquidos. Los electrolitos son estrictamente regulados para Theatre manager el equilibrio hidrosalino y Stage manager en el organismo. Entre los electrolitos que se miden en un perfil metablico bsico se incluyen los siguientes:  Potasio.  Sodio.  Cloruro.  Calcio.  Bicarbonato. QU TIPO DE MUESTRA SE TOMA? Para esta prueba, se extrae Truddie Coco de [place]. Por lo general, para extraerla, se introduce una aguja en una vena. [place]? El mdico puede pedirle que no coma ni beba nada antes de que le extraigan la [place]. No coma ni beba nada despus de la medianoche anterior al procedimiento o segn le haya indicado su mdico. QU SON LOS VALORES DE REFERENCIA? Los valores de referencia son los valores saludables establecidos despus de realizarle el anlisis a un grupo grande de personas sanas. Pueden variar [employer], laboratorios y hospitales. Es su responsabilidad retirar el  resultado del [place]. Consulte en el laboratorio o en el departamento en el que fue realizado el estudio cundo y cmo podr [employer]. Estos son los valores de referencia para cada parte del perfil metablico bsico: Glucosa  Cordn umbilical: de [id number] 96mg /dl o de 2,5a 5,4mmol/l (unidades SI).  Bebs prematuros: de 20a 60mg /dl o de 1,1a 3,94mmol/l.  Neonatos: de 30a 60mg /dl o de 1,7a 3,28mmol/l.  Bebs: de 40a 90mg /dl o de 2,2a 61mmol/l.  Nios menores de 2aos: de 60a 100mg /dl o de 3,3a 5,69mmol/l.  Adultos o nios mayores de 2aos:  En ayunas: de 70a 110mg /dl o menos de 6,60mmol/l.  Aleatorio (sin estar en ayunas o al azar): menos de o igual a 200mg /dl, o menos de 11,45mmol/l.  Ancianos: [employer] normales despus de [place]. Creatinina  Nios menores de 2aos: de 0,1 a 0,4 mg/dl.  Nios de 2 a 5aos: de 0,2 a 0,5 mg/dl.  Nios de 6 a 9aos: de 0,3 a 0,6 mg/dl.  Nios o adolescentes de 10 a 17aos: de 0,4 a 1 mg/dl.  Adultos de 18 a 40aos:  Mujeres: de 0,5 a 1 mg/dl.  Hombres: de 0,6 a 1,2 mg/dl.  Adultos de 41 a 60aos:  Mujeres: de 0,5 a 1,1 mg/dl.  Hombres: de 0,6 a 1,3 mg/dl.  Adultos mayores de [id number]:  Mujeres: de 0,5 a 1,2 mg/dl.  Hombres: de 0,7 a 1,3 mg/dl. Nitrgeno ureico en sangre (BUN)  Cordn umbilical: de [id number] 40mg /dl.  Recin nacidos: de 3a 12mg /dl.  Bebs: de 5a 18mg /dl.  Nios: de 5a 18mg /dl.  Adultos: De 10a 20mg /dl o de 3,6a 7,84mmol/l (unidades SI).  Ancianos: los [employer] ser levemente  ms altos que los [employer]. Potasio  Recin nacidos: de 3,9 a 5,9 mEq/l.  Bebs: de 4,1a 5,37mEq/l.  Nios: de 3,4a 4,27mEq/l.  Adultos o ancianos: de 3,5a 64mEq/l o de 3,5a 80mmol/l (unidades SI). Sodio  Recin nacidos: de 134 a 155mEq/l.  Bebs: de 134 a 118mEq/l.  Nios: de 136 a 161mEq/l.  Adultos o ancianos: de 136a 132mEq/l o de 136a  143mmol/l (unidades SI). Cloruro  Bebs prematuros: de 95a140mEq/l.  Recin nacidos: de 96a125mEq/l.  Nios: de 90a143mEq/l.  Adultos o ancianos: de 98a179mEq/l o de 98a175mmol/l (unidades SI). Calcio  Calcio total:  Recin nacidos de menos de 10das: de 7,6a 10,4mg /dl o de 1,9a 2,61mmol/l.  Cordn umbilical: de 9a 11,5mg /dl o de 2,25a 2,32mmol/l.  Entre los 10das de vida y los 2aos: de 9a 10,6mg /dl o de 2,30a 2,36mmol/l.  Nios: de 8,8a 10,8mg /dl o de 2,2a 2,60mmol/l.  Adultos: de 9a 10,5mg /dl o de 2,25a 2,2mmol/l.  Calcio ionizado:  Recin nacidos: de 4,20 a 5,58mg /dl o de 1,05 a 1,57mmol/l.  Entre los 80meses de vida y los 18aos: de 4,80 a 5,52mg /dl o de 1,20 a 1,41mmol/l.  Adultos: de 4,5 a 5,6mg /dl o de 1,05 a 1,56mmol/l. Bicarbonato  Recin nacidos: de 13a47mEq/l.  Bebs: de 20a55mEq/l.  Nios: de 20a51mEq/l.  Adultos o ancianos: de 23a 10mEq/l o de 23a 48mmol/l (unidades SI). [place]? La dieta y los niveles de actividad fsica pueden influir en los resultados de la prueba a [address], pueden ser la causa de que los valores estn fuera de los lmites normales. Sin embargo, en ocasiones, los valores que estn fuera de los lmites normales pueden indicar la presencia de un trastorno mdico.  Glucosa:  El aumento anormal de los niveles de glucosa (hiperglucemia) suele estar asociado con prediabetes y diabetes mellitus. Tambin puede presentarse cuando el organismo sufre una situacin de estrs intenso. Este estrs puede ser el resultado de una ciruga o de episodios tales como ictus o traumatismos. El hipertiroidismo y la pancreatitis o el cncer de pncreas tambin pueden provocar el aumento anormal de los niveles de [place].  La disminucin anormal de los niveles de glucosa (hipoglucemia) puede presentarse cuando hay hipotiroidismo y tumores atpicos que segregan insulina  (insulinoma).  Creatinina:  El aumento anormal de los niveles de creatinina se observa con ms frecuencia en los casos de insuficiencia renal y, adems, cuando hay hipertiroidismo, enfermedades relacionadas con el crecimiento excesivo del cuerpo, destruccin anormal del tejido muscular y distrofia muscular temprana.  La disminucin anormal de los niveles de creatinina puede indicar baja masa muscular asociada con desnutricin o distrofia muscular en etapa avanzada.  Nitrgeno ureico en sangre (BUN):  El aumento anormal de los niveles de nitrgeno ureico en sangre generalmente significa que los riones no estn funcionando normalmente.  La disminucin anormal del BUN puede observarse en los casos de desnutricin e insuficiencia heptica.  Potasio:  El aumento anormal de los niveles de potasio se observa con mayor frecuencia en las enfermedades renales, la destruccin masiva de los glbulos rojos, y la insuficiencia de las glndulas suprarrenales.  La disminucin anormal de los niveles de potasio se observa cuando los niveles de la hormona aldosterona son excesivos.  Sodio:  El aumento anormal de los niveles de sodio puede observarse cuando hay deshidratacin, sed excesiva y miccin excesiva debido a una disminucin anormal de los niveles de la hormona antidiurtica (diabetes inspida). Tambin pueden deberse a los niveles excesivos de [place] o cortisol en el cuerpo.  La disminucin anormal de  los niveles de sodio puede observarse cuando hay insuficiencia cardaca congestiva, cirrosis heptica, insuficiencia renal, as como en el sndrome de secrecin inadecuada de la hormona antidiurtica (SIHAD). Cloruro:  El aumento anormal de los niveles de cloruro puede observarse cuando hay insuficiencia renal aguda, diabetes inspida, diarrea prolongada e intoxicacin con aspirina o bromuro.  La disminucin anormal de los niveles de cloruro puede observarse cuando hay episodios prolongados de  vmitos, insuficiencia aguda de las glndulas suprarrenales y [place].  Calcio:  El aumento anormal de los niveles de calcio puede ser consecuencia de la actividad excesiva de las glndulas paratiroideas, ciertos tipos de cncer y un tipo de inflamacin que se produce en los casos de sarcoidosis y tuberculosis.  La disminucin anormal de los niveles de calcio puede observarse con la hipoactividad de las glndulas paratiroideas, la deficiencia de la vitaminaD y la pancreatitis aguda.  Bicarbonato:  El aumento anormal de los niveles de bicarbonato se observa despus de episodios prolongados de vmitos y Dispensing optician diurtico, que derivan en la disminucin de la cantidad de cido en el organismo (alcalosis metablica). Tambin puede observarse en las enfermedades que aumentan la cantidad de bicarbonato en el organismo. Estas afecciones incluyen trastornos hereditarios poco frecuentes que interfieren en la regulacin de los electrolitos por parte de los riones.  La disminucin anormal de los niveles de bicarbonato se observa en enfermedades que estimulan la produccin de cido del cuerpo (acidosis metablica). Entre estas afecciones, se incluyen la diabetes mellitus que no se puede controlar y la intoxicacin por aspirina, metanol o anticongelantes (etilenglicol). Hable con el mdico [employer], las opciones de tratamiento y, si es necesario, la necesidad de Optometrist ms [place]. Hable con el mdico si tiene [employer].   Esta informacin no tiene Marine scientist el consejo del mdico. Asegrese de hacerle al mdico cualquier pregunta que tenga.   Document Released: 06/10/2012 Document Revised: 07/15/2014 Elsevier Interactive Patient Education 2016 [place] tolerancia a la glucosa (Glucose Tolerance Test) La prueba de tolerancia a la glucosa (PTG) es una de las diversas pruebas que se usan para diagnosticar diabetes mellitus. La PTG es un  anlisis de [place] y [place] puede incluir un anlisis de [place]. La PTG verifica la manera en que el cuerpo procesa el azcar (glucosa). Para esta prueba, consumir una bebida que contiene un nivel alto de glucosa. Se controlarn los niveles de glucosa antes de consumir la bebida, y luego 1, 2, 3 y posiblemente 4 horas despus de haberla bebido. El mdico puede recomendarle que se realice la PTG si:  Tienen antecedentes familiares de diabetes.  Tiene mucho sobrepeso (es obeso).  Ha tenido infecciones que reaparecieron.  Ha tenido varios cortes o heridas que no se curaron rpidamente, en especial en las piernas y los pies.  Es mujer y tiene antecedentes de haber parido bebs muy grandes o antecedentes de muerte fetal repetida (beb nacido muerto).  Ha tenido glucosa en la orina o un nivel alto de azcar en la sangre:  Solicitor.  Despus de un infarto de miocardio, [place] o perodos prolongados de [employer]. La PTG dura entre 3 y 4 horas. No se le permitir comer ni beber nada durante la prueba, excepto la solucin de glucosa. Debe Nutritional therapist en que se realiza la prueba para asegurarse de que las [place] de sangre y [place] se tomen puntualmente. PREPARACIN PARA EL ESTUDIO Coma normalmente durante 3 das antes de la PTG e incluya  muchos alimentos ricos en carbohidratos. No coma ni beba nada, excepto agua, durante las ltimas 12 horas antes de la prueba. No debe fumar ni hacer ejercicio durante la prueba. Adems, el mdico puede pedirle que deje de tomar ciertos medicamentos antes de la prueba. RESULTADOS Es su responsabilidad retirar el resultado del [place]. Consulte en el laboratorio o en el departamento en el que fue realizado el estudio cundo y cmo podr [employer]. Comunquese con el mdico si tiene [employer]. Rango de [employer] rangos para los valores normales pueden variar entre diferentes laboratorios y  hospitales. Siempre debe consultar a su mdico despus de realizarse un anlisis u otros estudios para saber si los valores de sus [place] se consideran dentro de los lmites normales.  Los niveles normales de glucemia son los siguientes:  [place]: menos de 110mg /dl o menos de 6,51mmol/l (unidades SI).  Una hora despus de consumir la bebida con glucosa: menos de 200mg /dl o menos de 11,83mmol/l.  Dos horas despus de consumir la bebida con glucosa: menos de 140mg /dl o menos de 7,50mmol/l.  Tres horas despus de consumir la bebida con glucosa: de 70 a 115mg /dl o menos de 6,16mmol/l.  Cuatro horas despus de consumir la bebida con glucosa: de 70 a 115mg /dl o menos de 6,69mmol/l. El resultado normal del anlisis de [place] es negativo, lo que significa que no hay glucosa en la orina. Algunas sustancias pueden [employer] de la PTG. Estas pueden incluir lo siguiente:  Medicamentos para la presin arterial y la insuficiencia cardaca, como betabloqueantes, furosemida y tiacidas.  Antiinflamatorios, como aspirina.  Nicotina.  Algunos medicamentos psiquitricos.  Anticonceptivos orales.  Diurticos o corticoides. Significado de los [employer] estn fuera de los rangos de los valores normales Los resultados de la PTG por encima de los valores normales pueden indicar problemas de salud, tales como:  Diabetes mellitus.  Respuesta al estrs agudo.  Sndrome de [place].  Tumores como feocromocitoma o glucagonoma.  Insuficiencia renal crnica.  Pancreatitis.  Hipertiroidismo.  Infeccin actual. Hable con el mdico [employer]. El mdico [employer] para Optometrist un diagnstico y Teacher, adult education un plan de tratamiento adecuado para usted.   Esta informacin no tiene Marine scientist el consejo del mdico. Asegrese de hacerle al mdico cualquier pregunta que tenga.   Document Released: 04/14/2013 Document Revised: 07/15/2014 Elsevier  Interactive Patient Education [employer].

## 2021-08-01 ENCOUNTER — Other Ambulatory Visit: Payer: Self-pay | Admitting: Internal Medicine

## 2021-08-01 ENCOUNTER — Encounter: Payer: Self-pay | Admitting: Gastroenterology

## 2021-08-01 DIAGNOSIS — R748 Abnormal levels of other serum enzymes: Secondary | ICD-10-CM

## 2021-08-09 ENCOUNTER — Other Ambulatory Visit: Payer: Self-pay

## 2021-08-09 ENCOUNTER — Ambulatory Visit
Admission: RE | Admit: 2021-08-09 | Discharge: 2021-08-09 | Disposition: A | Discharge status: Home / Self Care | Payer: Medicare HMO | Source: Ambulatory Visit | Attending: Internal Medicine | Admitting: Internal Medicine

## 2021-08-09 DIAGNOSIS — R748 Abnormal levels of other serum enzymes: Secondary | ICD-10-CM

## 2021-08-29 ENCOUNTER — Encounter: Payer: Self-pay | Admitting: Gastroenterology

## 2021-09-12 ENCOUNTER — Other Ambulatory Visit: Payer: Self-pay | Admitting: Internal Medicine

## 2021-09-12 DIAGNOSIS — N1330 Unspecified hydronephrosis: Secondary | ICD-10-CM

## 2021-09-17 ENCOUNTER — Other Ambulatory Visit: Payer: Self-pay

## 2021-09-17 ENCOUNTER — Ambulatory Visit (AMBULATORY_SURGERY_CENTER): Payer: Medicare HMO | Admitting: *Deleted

## 2021-09-17 VITALS — Ht 66.0 in | Wt 175.0 lb

## 2021-09-17 DIAGNOSIS — Z1211 Encounter for screening for malignant neoplasm of colon: Secondary | ICD-10-CM

## 2021-09-17 NOTE — Progress Notes (Signed)
No egg or soy allergy known to patient  ?No issues known to pt with past sedation with any surgeries or procedures ?Patient denies ever being told they had issues or difficulty with intubation  ?No FH of Malignant Hyperthermia ?Pt is not on diet pills ?Pt is not on  home 02  ?Pt is not on blood thinners  ?Pt denies issues with constipation  ?No A fib or A flutter ? ? ? ? ?Due to the COVID-19 pandemic we are asking patients to follow certain guidelines in PV and the [place]   ?Pt aware of COVID protocols and [facility] guidelines  ? ?PV completed over the phone. Pt verified name, DOB, address and insurance during PV today. PV completed over the phone with an interpreter. When told he had to have someone with him and to drive him home after procedure pt stated he did not have anyone. He stated he did not understand why he had to have someone with him. Explained to pt several times the policy requiring a care partner. Pt stated he would only come with someone on the weekend. Explained again with the interpreter that we were only open during the week and he had to have someone with him. Pt became angry and said he wanted to cancel.  I attempted to explain again and pt said to just cancel procedure. Asked if he would like to reschedule to a time when someone could come with him and he asked about an appt. In June. Explained our schedule does not go out that far yet. Pt stated he would call back to reschedule at a later date. ?

## 2021-09-28 ENCOUNTER — Encounter: Payer: Medicare HMO | Admitting: Gastroenterology

## 2021-10-24 ENCOUNTER — Other Ambulatory Visit: Payer: Self-pay | Admitting: Internal Medicine

## 2021-10-24 DIAGNOSIS — R0989 Other specified symptoms and signs involving the circulatory and respiratory systems: Secondary | ICD-10-CM

## 2021-10-24 DIAGNOSIS — M79661 Pain in right lower leg: Secondary | ICD-10-CM

## 2021-10-30 ENCOUNTER — Ambulatory Visit
Admission: RE | Admit: 2021-10-30 | Discharge: 2021-10-30 | Disposition: A | Discharge status: Home / Self Care | Payer: Medicare HMO | Source: Ambulatory Visit | Attending: Internal Medicine | Admitting: Internal Medicine

## 2021-10-30 DIAGNOSIS — N1330 Unspecified hydronephrosis: Secondary | ICD-10-CM

## 2021-11-02 ENCOUNTER — Ambulatory Visit
Admission: RE | Admit: 2021-11-02 | Discharge: 2021-11-02 | Disposition: A | Discharge status: Home / Self Care | Payer: Medicare HMO | Source: Ambulatory Visit | Attending: Internal Medicine | Admitting: Internal Medicine

## 2021-11-02 DIAGNOSIS — R0989 Other specified symptoms and signs involving the circulatory and respiratory systems: Secondary | ICD-10-CM

## 2021-11-02 DIAGNOSIS — M79662 Pain in left lower leg: Secondary | ICD-10-CM

## 2021-11-06 ENCOUNTER — Inpatient Hospital Stay: Admission: RE | Admit: 2021-11-06 | Payer: Medicare HMO | Source: Ambulatory Visit

## 2021-11-09 ENCOUNTER — Ambulatory Visit: Payer: Medicare HMO | Admitting: Gastroenterology

## 2021-11-09 ENCOUNTER — Encounter: Payer: Self-pay | Admitting: Gastroenterology

## 2021-11-12 ENCOUNTER — Telehealth: Payer: Self-pay | Admitting: *Deleted

## 2021-11-12 DIAGNOSIS — Z1211 Encounter for screening for malignant neoplasm of colon: Secondary | ICD-10-CM

## 2021-11-12 NOTE — Telephone Encounter (Signed)
Pt rescheduled his colon to 5-22 Monday new Spanish miralax instructions for pt to pick up 2nd floor today or tomorrow  ?

## 2021-11-13 ENCOUNTER — Ambulatory Visit
Admission: RE | Admit: 2021-11-13 | Discharge: 2021-11-13 | Disposition: A | Discharge status: Home / Self Care | Payer: Medicare HMO | Source: Ambulatory Visit | Attending: Internal Medicine | Admitting: Internal Medicine

## 2021-11-13 MED ORDER — PEG 3350-KCL-NA BICARB-NACL 420 G PO SOLR: 4000.0000 mL | Freq: Once | ORAL | 0 refills | Status: AC

## 2021-11-13 NOTE — Telephone Encounter (Signed)
Patient here requesting a prep medication that he does not have to pay for. Golytely Rx sent to pharmacy and new prep instructions given to pt and went over by Georgette Shell.  ?

## 2021-11-13 NOTE — Addendum Note (Signed)
Addended by: Levonne Spiller on: 11/13/2021 09:44 AM ? ? Modules accepted: Orders ? ?

## 2021-11-25 ENCOUNTER — Telehealth: Payer: Self-pay | Admitting: Physician Assistant

## 2021-11-25 NOTE — Telephone Encounter (Signed)
Telephone Call  Spent 15 minutes on the phone with patient and interpreter clarifying his prep instructions.  He had multiple questions and I think we got through all of them.  Clarified that he needs to be on just clear liquids and discussed what those are and what they can be and when he needs to stop putting anything in his mouth  Reviewed his instructions that were sent out to him on 11/13/2021.  At the end of the call he thanked me.  Ellouise Newer, PA-C

## 2021-11-26 ENCOUNTER — Encounter: Payer: Self-pay | Admitting: Gastroenterology

## 2021-11-26 ENCOUNTER — Ambulatory Visit (AMBULATORY_SURGERY_CENTER): Payer: Medicare HMO | Admitting: Gastroenterology

## 2021-11-26 VITALS — BP 151/83 | HR 57 | Temp 98.6°F | Resp 10 | Ht 66.0 in | Wt 175.0 lb

## 2021-11-26 DIAGNOSIS — K635 Polyp of colon: Secondary | ICD-10-CM

## 2021-11-26 DIAGNOSIS — Z1211 Encounter for screening for malignant neoplasm of colon: Secondary | ICD-10-CM

## 2021-11-26 DIAGNOSIS — D125 Benign neoplasm of sigmoid colon: Secondary | ICD-10-CM

## 2021-11-26 MED ORDER — SODIUM CHLORIDE 0.9 % IV SOLN: 500.0000 mL | Freq: Once | INTRAVENOUS | Status: DC

## 2021-11-26 NOTE — Op Note (Signed)
[facility] Patient Name: Nicholas Barton Procedure Date: 11/26/2021 4:00 PM MRN: [id number] Endoscopist: Thornton Park MD, MD Age: 66 Referring MD:  Date of Birth: 1955-09-05 Gender: Male Account #: [id number] Procedure:                Colonoscopy Indications:              Screening for colorectal malignant neoplasm, This                            is the patient's first colonoscopy Medicines:                Monitored Anesthesia Care Procedure:                Pre-Anesthesia Assessment:                           - Prior to the procedure, a History and Physical                            was performed, and patient medications and                            allergies were reviewed. The patient's tolerance of                            previous anesthesia was also reviewed. The risks                            and benefits of the procedure and the sedation                            options and risks were discussed with the patient.                            All questions were answered, and informed consent                            was obtained. Prior Anticoagulants: The patient has                            taken no previous anticoagulant or antiplatelet                            agents. ASA Grade Assessment: II - A patient with                            mild systemic disease. After reviewing the risks                            and benefits, the patient was deemed in                            satisfactory condition to undergo the procedure.  After obtaining informed consent, the colonoscope                            was passed under direct vision. Throughout the                            procedure, the patient's blood pressure, pulse, and                            oxygen saturations were monitored continuously. The                            CF [id number] #[id number] was introduced through the anus                            and advanced to the  3 cm into the ileum. A second                            forward view of the right colon was performed. The                            colonoscopy was performed without difficulty. The                            patient tolerated the procedure well. The quality                            of the bowel preparation was good. The terminal                            ileum, ileocecal valve, appendiceal orifice, and                            rectum were photographed. Scope In: 4:18:20 PM Scope Out: 4:34:52 PM Scope Withdrawal Time: 0 hours 11 minutes 34 seconds  Total Procedure Duration: 0 hours 16 minutes 32 seconds  Findings:                 The perianal and digital rectal examinations were                            normal.                           A 2 mm polyp was found in the sigmoid colon. The                            polyp was sessile. The polyp was removed with a                            cold snare. Resection and retrieval were complete.                            Estimated blood loss was  minimal.                           Non-bleeding internal hemorrhoids were found.                           The exam was otherwise without abnormality on                            direct and retroflexion views. Complications:            No immediate complications. Estimated Blood Loss:     Estimated blood loss: none. Impression:               - One 2 mm polyp in the sigmoid colon, removed with                            a cold snare. Resected and retrieved.                           - Non-bleeding internal hemorrhoids.                           - The examination was otherwise normal on direct                            and retroflexion views. Recommendation:           - Patient has a contact number available for                            emergencies. The signs and symptoms of potential                            delayed complications were discussed with the                            patient.  Return to normal activities tomorrow.                            Written discharge instructions were provided to the                            patient.                           - Resume previous diet.                           - Continue present medications.                           - Await pathology results.                           - Repeat colonoscopy date to be determined after  pending pathology results are reviewed for                            surveillance.                           - Emerging evidence supports eating a diet of                            fruits, vegetables, grains, calcium, and yogurt                            while reducing red meat and alcohol may reduce the                            risk of colon cancer.                           - Thank you for allowing me to be involved in your                            colon cancer prevention. Thornton Park MD, MD 11/26/2021 4:43:32 PM This report has been signed electronically.

## 2021-11-26 NOTE — Progress Notes (Unsigned)
I have reviewed the patient's medical history in detail and updated the computerized patient record.   VS BY DT. 

## 2021-11-26 NOTE — Progress Notes (Unsigned)
Referring Provider: No ref. provider found Primary Care Physician:  Pcp, No  Indication for Procedure:  Colon cancer screening   IMPRESSION:  Need for colon cancer screening Appropriate candidate for monitored anesthesia care  PLAN: Colonoscopy in the [facility] today   HPI: Nicholas Barton Reason is a 66 y.o. male presents for screening colonoscopy.  No prior colonoscopy or colon cancer screening.  No baseline GI symptoms.   No known family history of colon cancer or polyps. No family history of uterine/endometrial cancer, pancreatic cancer or gastric/stomach cancer.   Past Medical History:  Diagnosis Date   Blood transfusion without reported diagnosis 1999   Chronic back pain    Chronic neck pain    Chronic shoulder pain    Diabetes mellitus without complication ([place])    Eczema    ED (erectile dysfunction)    GERD (gastroesophageal reflux disease)    HLD (hyperlipidemia)    Hypothyroidism    Lower urinary tract symptoms (LUTS)    Onychomycosis     Past Surgical History:  Procedure Laterality Date   CHOLECYSTECTOMY, [place]   NECK SURGERY  2007   ?with shoulder surgery   SHOULDER SURGERY  2007   THYROID SURGERY  2005   radioablation    Current Outpatient Medications  Medication Sig Dispense Refill   Cholecalciferol (VITAMIN D) 2000 UNITS CAPS Take 1 capsule by mouth daily.     levothyroxine (SYNTHROID, LEVOTHROID) 125 MCG tablet Take 125 mcg by mouth daily before breakfast.     lisinopril (ZESTRIL) 10 MG tablet Take by mouth.     metFORMIN (GLUCOPHAGE) 1000 MG tablet Take 1,000 mg by mouth 2 (two) times daily with a meal.     rosuvastatin (CRESTOR) 10 MG tablet Take by mouth.     simvastatin (ZOCOR) 40 MG tablet Take by mouth.     traZODone (DESYREL) 50 MG tablet Take by mouth.     Blood Glucose Monitoring Suppl (TRUE METRIX GO GLUCOSE METER) w/Device KIT      cyclobenzaprine (FLEXERIL) 10 MG tablet Take 0.5-1 tablets (5-10 mg total)  by mouth at bedtime as needed for muscle spasms. 30 tablet 1   glimepiride (AMARYL) 1 MG tablet Take by mouth. (Patient not taking: Reported on 11/26/2021)     glipiZIDE (GLUCOTROL) 5 MG tablet Take 2.5 mg by mouth daily before breakfast. (Patient not taking: Reported on 11/26/2021)     glucose blood test strip Use as instructed 100 each 0   hydrochlorothiazide (HYDRODIURIL) 25 MG tablet Take 25 mg by mouth daily.     ibuprofen (ADVIL,MOTRIN) 200 MG tablet Take 200 mg by mouth every 6 (six) hours as needed.       Lancets (ACCU-CHEK MULTICLIX) lancets Use as instructed 100 each 12   levothyroxine (SYNTHROID, LEVOTHROID) 100 MCG tablet Take 1.5 tablets (150 mcg total) by mouth daily before breakfast. (Patient not taking: Reported on 05/19/2015) 30 tablet 2   ReliOn Lancets Thin 26G MISC      Tamsulosin HCl (FLOMAX) 0.4 MG CAPS Take 1 capsule (0.4 mg total) by mouth daily. (Patient not taking: Reported on 05/19/2015) 30 capsule 3   Current Facility-Administered Medications  Medication Dose Route Frequency Provider Last Rate Last Admin   0.9 %  sodium chloride infusion  500 mL Intravenous Once Thornton Park, MD        Allergies as of 11/26/2021 - Review Complete 11/26/2021  Allergen Reaction Noted   Penicillins  12/20/2010  Family History  Problem Relation Age of Onset   Hypertension Mother    Hypertension Father    Alcohol abuse Maternal Grandfather    Heart attack Neg Hx    Diabetes Neg Hx    Colon cancer Neg Hx      Physical Exam: General:   Alert,  well-nourished, pleasant and cooperative in NAD Head:  Normocephalic and atraumatic. Eyes:  Sclera clear, no icterus.   Conjunctiva pink. Mouth:  No deformity or lesions.   Neck:  Supple; no masses or thyromegaly. Lungs:  Clear throughout to auscultation.   No wheezes. Heart:  Regular rate and rhythm; no murmurs. Abdomen:  Soft, non-tender, nondistended, normal bowel sounds, no rebound or guarding.  Msk:  Symmetrical. No boney  deformities LAD: No inguinal or umbilical LAD Extremities:  No clubbing or edema. Neurologic:  Alert and  oriented x4;  grossly nonfocal Skin:  No obvious rash or bruise. Psych:  Alert and cooperative. Normal mood and affect.     Studies/Results: No results found.    Kairah Leoni L. Tarri Glenn, MD, MPH 11/26/2021, 4:04 PM

## 2021-11-26 NOTE — Patient Instructions (Signed)
Handouts given on hemorrhoids and polyps. Await pathology results. Resume previous diet and continue present medications. Repeat colonoscopy for surveillance will be determined based off of pathology results.    YOU HAD AN ENDOSCOPIC PROCEDURE TODAY AT [facility]:   Refer to the procedure report that was given to you for any specific questions about what was found during the examination.  If the procedure report does not answer your questions, please call your gastroenterologist to clarify.  If you requested that your care partner not be given the details of your procedure findings, then the procedure report has been included in a sealed envelope for you to review at your convenience later.  YOU SHOULD EXPECT: Some feelings of bloating in the abdomen. Passage of more gas than usual.  Walking can help get rid of the air that was put into your GI tract during the procedure and reduce the bloating. If you had a lower endoscopy (such as a colonoscopy or flexible sigmoidoscopy) you may notice spotting of blood in your stool or on the toilet paper. If you underwent a bowel prep for your procedure, you may not have a normal bowel movement for a few days.  Please Note:  You might notice some irritation and congestion in your nose or some drainage.  This is from the oxygen used during your procedure.  There is no need for concern and it should clear up in a day or so.  SYMPTOMS TO REPORT IMMEDIATELY:  Following lower endoscopy (colonoscopy or flexible sigmoidoscopy):  Excessive amounts of blood in the stool  Significant tenderness or worsening of abdominal pains  Swelling of the abdomen that is new, acute  Fever of 100F or higher  For urgent or emergent issues, a gastroenterologist can be reached at any hour by calling [phone]. Do not use MyChart messaging for urgent concerns.    DIET:  We do recommend a small meal at first, but then you may proceed to your regular diet.   Drink plenty of fluids but you should avoid alcoholic beverages for 24 hours.  ACTIVITY:  You should plan to take it easy for the rest of today and you should NOT DRIVE or use heavy machinery until tomorrow (because of the sedation medicines used during the test).    FOLLOW UP: Our staff will call the number listed on your records 48-72 hours following your procedure to check on you and address any questions or concerns that you may have regarding the information given to you following your procedure. If we do not reach you, we will leave a message.  We will attempt to reach you two times.  During this call, we will ask if you have developed any symptoms of COVID 19. If you develop any symptoms (ie: fever, flu-like symptoms, shortness of breath, cough etc.) before then, please call [phone].  If you test positive for Covid 19 in the 2 weeks post procedure, please call and report this information to [place].    If any biopsies were taken you will be contacted by phone or by letter within the next 1-3 weeks.  Please call us at [phone] if you have not heard about the biopsies in 3 weeks.    SIGNATURES/CONFIDENTIALITY: You and/or your care partner have signed paperwork which will be entered into your electronic medical record.  These signatures attest to the fact that that the information above on your After Visit Summary has been reviewed and is understood.  Full responsibility of the  confidentiality of this discharge information lies with you and/or your care-partner.    USTED TUVO UN PROCEDIMIENTO ENDOSCPICO HOY EN EL [facility]:   Lea el informe del procedimiento que se le entreg para cualquier pregunta especfica sobre lo que se Primary school teacher.  Si el informe del examen no responde a sus preguntas, por favor llame a su gastroenterlogo para aclararlo.  Si usted solicit que no se le den [employer] de lo que se Estate manager/land agent en su procedimiento al [employer] va a  cuidar, entonces el informe del procedimiento se ha incluido en un sobre sellado para que usted lo revise despus cuando le sea ms conveniente.   LO QUE PUEDE ESPERAR: Algunas sensaciones de hinchazn en el abdomen.  Puede tener ms gases de lo normal.  El caminar puede ayudarle a eliminar el aire que se le puso en el tracto gastrointestinal durante el procedimiento y reducir la hinchazn.  Si le hicieron una endoscopia inferior (como una colonoscopia o una sigmoidoscopia flexible), podra notar manchas de sangre en las heces fecales o en el papel higinico.  Si se someti a una preparacin intestinal para su procedimiento, es posible que no tenga una evacuacin intestinal normal durante [employer].   Tenga en cuenta:  Es posible que note un poco de irritacin y congestin en la nariz o algn drenaje.  Esto es debido al oxgeno [employer] durante su procedimiento.  No hay que preocuparse y esto debe desaparecer ms o Scientist, research (medical).   SNTOMAS PARA REPORTAR INMEDIATAMENTE:  Despus de una endoscopia inferior (colonoscopia o sigmoidoscopia flexible):  Cantidades excesivas de sangre en las heces fecales  Sensibilidad significativa o empeoramiento de los dolores abdominales   Hinchazn aguda del abdomen que antes no tena   Fiebre de 100F o ms   Despus de la endoscopia superior (EGD)  Vmitos de Biochemist, clinical o material como caf molido   Dolor en el pecho o dolor debajo de los omplatos que antes no tena   Dolor o dificultad persistente para tragar  Falta de aire que antes no tena   Fiebre de 100F o ms  Heces fecales negras y pegajosas   Para asuntos urgentes o de Freight forwarder, puede comunicarse con un gastroenterlogo a cualquier hora llamando al [phone].  DIETA:  Recomendamos una comida pequea al principio, pero luego puede continuar con su dieta normal.  Tome muchos lquidos, Teacher, adult education las bebidas alcohlicas durante 24 horas.    ACTIVIDAD:  Debe planear tomarse las cosas  con calma por el resto del da y no debe CONDUCIR ni usar maquinaria pesada Programmer, applications (debido a los medicamentos de sedacin utilizados durante el examen).     SEGUIMIENTO: Nuestro personal llamar al nmero que aparece en su historial al siguiente da hbil de su procedimiento para ver cmo se siente y para responder cualquier pregunta o inquietud que pueda tener con respecto a la informacin que se le dio despus del procedimiento. Si no podemos contactarle, le dejaremos un mensaje.  Sin embargo, si se siente bien y no tiene Paediatric nurse, no es necesario que nos devuelva la llamada.  Asumiremos que ha regresado a sus actividades diarias normales sin incidentes. Si se le tomaron algunas biopsias, le contactaremos por telfono o por carta en las prximas 3 semanas.  Si no ha sabido [employer] biopsias en el transcurso de 3 semanas, por favor llmenos al [phone].   FIRMAS/CONFIDENCIALIDAD: Usted y/o el acompaante que le cuide han [employer]  documentos que se ingresarn en su historial mdico electrnico.  Estas firmas atestiguan el hecho de que la informacin anterior

## 2021-11-26 NOTE — Progress Notes (Unsigned)
To pacu, VSS. Report to Rn.TB

## 2021-11-27 ENCOUNTER — Telehealth: Payer: Self-pay

## 2021-11-27 NOTE — Telephone Encounter (Signed)
No answer, left message to call back later today, B.Kalon Erhardt RN. 

## 2021-11-27 NOTE — Telephone Encounter (Signed)
  Follow up Call-     11/26/2021    3:38 PM  Call back number  Post procedure Call Back phone  # [phone]  Permission to leave phone message Yes     Patient questions:  Do you have a fever, pain , or abdominal swelling? No. Pain Score  0 *  Have you tolerated food without any problems? Yes.    Have you been able to return to your normal activities? Yes.    Do you have any questions about your discharge instructions: Diet   No. Medications  No. Follow up visit  No.  Do you have questions or concerns about your Care? No.  Actions: * If pain score is 4 or above: No action needed, pain <4.

## 2021-12-13 ENCOUNTER — Encounter: Payer: Self-pay | Admitting: Gastroenterology

## 2022-02-19 ENCOUNTER — Other Ambulatory Visit: Payer: Self-pay | Admitting: Internal Medicine

## 2022-02-19 ENCOUNTER — Ambulatory Visit
Admission: RE | Admit: 2022-02-19 | Discharge: 2022-02-19 | Disposition: A | Discharge status: Home / Self Care | Payer: Medicare HMO | Source: Ambulatory Visit | Attending: Internal Medicine | Admitting: Internal Medicine

## 2022-02-19 DIAGNOSIS — M25469 Effusion, unspecified knee: Secondary | ICD-10-CM

## 2022-02-19 DIAGNOSIS — M25561 Pain in right knee: Secondary | ICD-10-CM

## 2022-03-21 ENCOUNTER — Ambulatory Visit: Payer: Medicare HMO | Admitting: Physician Assistant

## 2022-09-10 ENCOUNTER — Emergency Department (HOSPITAL_COMMUNITY): Payer: Medicare HMO

## 2022-09-10 ENCOUNTER — Emergency Department (HOSPITAL_COMMUNITY)
Admission: EM | Admit: 2022-09-10 | Discharge: 2022-09-10 | Disposition: A | Discharge status: Home / Self Care | Payer: Medicare HMO | Attending: Emergency Medicine | Admitting: Emergency Medicine

## 2022-09-10 ENCOUNTER — Other Ambulatory Visit: Payer: Self-pay

## 2022-09-10 ENCOUNTER — Encounter (HOSPITAL_COMMUNITY): Payer: Self-pay

## 2022-09-10 DIAGNOSIS — R110 Nausea: Secondary | ICD-10-CM | POA: Diagnosis not present

## 2022-09-10 DIAGNOSIS — K5900 Constipation, unspecified: Secondary | ICD-10-CM | POA: Insufficient documentation

## 2022-09-10 DIAGNOSIS — M546 Pain in thoracic spine: Secondary | ICD-10-CM | POA: Diagnosis not present

## 2022-09-10 DIAGNOSIS — M549 Dorsalgia, unspecified: Secondary | ICD-10-CM | POA: Diagnosis present

## 2022-09-10 LAB — COMPREHENSIVE METABOLIC PANEL
ALT: 34 U/L (ref 0–44)
AST: 30 U/L (ref 15–41)
Albumin: 3.7 g/dL (ref 3.5–5.0)
Alkaline Phosphatase: 115 U/L (ref 38–126)
Anion gap: 10 (ref 5–15)
BUN: 17 mg/dL (ref 8–23)
CO2: 22 mmol/L (ref 22–32)
Calcium: 9.5 mg/dL (ref 8.9–10.3)
Chloride: 99 mmol/L (ref 98–111)
Creatinine, Ser: 0.85 mg/dL (ref 0.61–1.24)
GFR, Estimated: >60 mL/min (ref >60)
Glucose, Bld: 277 mg/dL — ABNORMAL HIGH (ref 70–99)
Potassium: 4.2 mmol/L (ref 3.5–5.1)
Sodium: 131 mmol/L — ABNORMAL LOW (ref 135–145)
Total Bilirubin: 0.5 mg/dL (ref 0.3–1.2)
Total Protein: 6.5 g/dL (ref 6.5–8.1)

## 2022-09-10 LAB — CBC WITH DIFFERENTIAL/PLATELET
Abs Immature Granulocytes: 0.02 10*3/uL (ref 0.00–0.07)
Basophils Absolute: 0 10*3/uL (ref 0.0–0.1)
Basophils Relative: 0 %
Eosinophils Absolute: 0.1 10*3/uL (ref 0.0–0.5)
Eosinophils Relative: 1 %
HCT: 42.4 % (ref 39.0–52.0)
Hemoglobin: 14.3 g/dL (ref 13.0–17.0)
Immature Granulocytes: 0 %
Lymphocytes Relative: 17 %
Lymphs Abs: 1.3 10*3/uL (ref 0.7–4.0)
MCH: 29.7 pg (ref 26.0–34.0)
MCHC: 33.7 g/dL (ref 30.0–36.0)
MCV: 88.1 fL (ref 80.0–100.0)
Monocytes Absolute: 0.6 10*3/uL (ref 0.1–1.0)
Monocytes Relative: 8 %
Neutro Abs: 5.8 10*3/uL (ref 1.7–7.7)
Neutrophils Relative %: 74 %
Platelets: 302 10*3/uL (ref 150–400)
RBC: 4.81 MIL/uL (ref 4.22–5.81)
RDW: 12.7 % (ref 11.5–15.5)
WBC: 7.9 10*3/uL (ref 4.0–10.5)
nRBC: 0 % (ref 0.0–0.2)

## 2022-09-10 LAB — URINALYSIS, ROUTINE W REFLEX MICROSCOPIC
Bacteria, UA: NONE SEEN
Bilirubin Urine: NEGATIVE
Glucose, UA: >=500 mg/dL — AB
Hgb urine dipstick: NEGATIVE
Ketones, ur: 5 mg/dL — AB
Leukocytes,Ua: NEGATIVE
Nitrite: NEGATIVE
Protein, ur: NEGATIVE mg/dL
Specific Gravity, Urine: 1.023 (ref 1.005–1.030)
pH: 6 (ref 5.0–8.0)

## 2022-09-10 MED ORDER — ONDANSETRON 4 MG PO TBDP
4.0000 mg | ORAL_TABLET | Freq: Once | ORAL | Status: AC
  Administered 2022-09-10: 4 mg via ORAL
  Filled 2022-09-10: qty 1

## 2022-09-10 MED ORDER — OXYCODONE-ACETAMINOPHEN 5-325 MG PO TABS
1.0000 | ORAL_TABLET | Freq: Once | ORAL | Status: AC
  Administered 2022-09-10: 1 via ORAL
  Filled 2022-09-10: qty 1

## 2022-09-10 MED ORDER — DOCUSATE SODIUM 100 MG PO CAPS: 100.0000 mg | ORAL_CAPSULE | Freq: Two times a day (BID) | ORAL | 0 refills | Status: AC

## 2022-09-10 MED ORDER — METHOCARBAMOL 500 MG PO TABS: 500.0000 mg | ORAL_TABLET | Freq: Two times a day (BID) | ORAL | 0 refills | Status: AC | PRN

## 2022-09-10 NOTE — Discharge Instructions (Addendum)
If you develop worsening, recurrent, or continued back pain, numbness or weakness in the legs, incontinence of your bowels or bladders, numbness of your buttocks, fever, abdominal pain, or any other new/concerning symptoms then return to the ER for evaluation.   You are being prescribed a muscle relaxer called Robaxin.  Do not take other muscle laxer such as Flexeril and do not take at the same time as the tramadol.  You may also take ibuprofen for pain.  You can also take Tylenol.  Si presenta dolor de espalda que empeora, recurrente o continuo, entumecimiento o debilidad en las piernas, incontinencia intestinal o vesical, entumecimiento de las nalgas, fiebre, dolor abdominal o cualquier otro sntoma nuevo o preocupante, regrese a la sala de emergencias para una evaluacin.   Le recetan un relajante muscular llamado Robaxin. No tome otros laxantes musculares como Flexeril y no lo tome al mismo tiempo que el tramadol. Tambin puedes tomar ibuprofeno para Conservation officer, historic buildings. Tambin puedes tomar Tylenol.

## 2022-09-10 NOTE — ED Triage Notes (Signed)
Spanish interpreter: [phone]  Pt reports right sided lower back pain for the 2.5 weeks. Saw PCP last week and was told he may have kidney stones. Denies blood in urine. Lots of nausea, taking tramadol without relief.

## 2022-09-10 NOTE — ED Provider Triage Note (Signed)
Emergency Medicine Provider Triage Evaluation Note  Nicholas Barton , a 67 y.o. male  was evaluated in triage.  Pt complains of right flank pain.  Patient reports has been having this pain for about 2 and half weeks.  Was advised by primary care provider come to the emergency department with concern for possible kidney stone.  No prior history of kidney stones.  Denies dysuria, hematuria.  Increased urinary frequency present.  Reports that nausea becomes more severe when pain becomes exacerbated with some positional changes.  Review of Systems  Positive: As above Negative: As above  Physical Exam  BP 132/87   Pulse 82   Temp 98.4 F (36.9 C) (Oral)   Resp 18   Ht '5\' 7"'$  (1.702 m)   Wt 81.6 kg   SpO2 99%   BMI 28.19 kg/m  Gen:   Awake, no distress   Resp:  Normal effort  MSK:   Moves extremities without difficulty  Other:  Right CVA tenderness  Medical Decision Making  Medically screening exam initiated at 12:31 PM.  Appropriate orders placed.  Earon Ellender was informed that the remainder of the evaluation will be completed by another provider, this initial triage assessment does not replace that evaluation, and the importance of remaining in the ED until their evaluation is complete.     Luvenia Heller, PA-C 09/10/22 1232

## 2022-09-10 NOTE — ED Provider Notes (Signed)
[facility] Provider Note   CSN: [id number] Arrival date & time: 09/10/22  1141     History  Chief Complaint  Patient presents with   Back Pain    Nicholas Barton is a 67 y.o. male.  HPI 67 year old male presents with 2 and half weeks of right-sided back pain.  History is taken with the Spanish interpreter.  Has been having on and off pain that typically occurs whenever he moves a certain way or walks.  No shortness of breath or abdominal pain.  Has had some occasional nausea as well as some constipation.  At times has noticed some blood in the stool though he has not noticed that in a couple days.  He states he had a colonoscopy with a polyp a month ago or so.  Otherwise the back pain does not radiate, he has no fever or urinary symptoms.  He saw his PCP told him he might have a kidney stone.  He denies any weakness or numbness in the lower extremities and no incontinence.  He has been taking tramadol for pain.  Home Medications Prior to Admission medications   Medication Sig Start Date End Date Taking? Authorizing Provider  docusate sodium (COLACE) 100 MG capsule Take 1 capsule (100 mg total) by mouth every 12 (twelve) hours. 09/10/22  Yes Sherwood Gambler, MD  DULoxetine (CYMBALTA) 30 MG capsule Take 30 mg by mouth daily.   Yes [provider]  famotidine (PEPCID) 40 MG tablet Take 40 mg by mouth 2 (two) times daily.   Yes [provider]  glimepiride (AMARYL) 1 MG tablet Take by mouth. 09/06/21  Yes [provider]  levothyroxine (SYNTHROID, LEVOTHROID) 100 MCG tablet Take 1.5 tablets (150 mcg total) by mouth daily before breakfast. Patient taking differently: Take 100 mcg by mouth daily before breakfast. 10/18/13  Yes Gerda Diss, DO  lisinopril (ZESTRIL) 10 MG tablet Take by mouth. 08/21/21  Yes [provider]  metFORMIN (GLUCOPHAGE) 1000 MG tablet Take 1,000 mg by mouth 2 (two) times daily with a meal.    Yes [provider]  methocarbamol (ROBAXIN) 500 MG tablet Take 1 tablet (500 mg total) by mouth 2 (two) times daily as needed for muscle spasms. 09/10/22  Yes Sherwood Gambler, MD  rosuvastatin (CRESTOR) 10 MG tablet Take by mouth. 08/02/21  Yes [provider]  Tamsulosin HCl (FLOMAX) 0.4 MG CAPS Take 1 capsule (0.4 mg total) by mouth daily. 07/21/12  Yes Gerda Diss, DO  traMADol (ULTRAM) 50 MG tablet Take 50 mg by mouth 2 (two) times daily.   Yes [provider]  Blood Glucose Monitoring Suppl (TRUE METRIX GO GLUCOSE METER) w/Device KIT  08/29/21   [provider]  Cholecalciferol (VITAMIN D) 2000 UNITS CAPS Take 1 capsule by mouth daily.    [provider]  glipiZIDE (GLUCOTROL) 5 MG tablet Take 2.5 mg by mouth daily before breakfast. Patient not taking: Reported on 11/26/2021    [provider]  glucose blood test strip Use as instructed 05/19/15   Deno Etienne, DO  hydrochlorothiazide (HYDRODIURIL) 25 MG tablet Take 25 mg by mouth daily.    [provider]  ibuprofen (ADVIL,MOTRIN) 200 MG tablet Take 200 mg by mouth every 6 (six) hours as needed.      [provider]  Lancets (ACCU-CHEK MULTICLIX) lancets Use as instructed 05/19/15   Voncille Lo, MD  levothyroxine (SYNTHROID, LEVOTHROID) 125 MCG tablet Take 125 mcg  by mouth daily before breakfast.    [provider]  ReliOn Lancets Thin 26G [place]  08/28/21   [provider]  simvastatin (ZOCOR) 40 MG tablet Take by mouth. 08/21/21   [provider]  traZODone (DESYREL) 50 MG tablet Take by mouth. 09/06/21   [provider]      Allergies    Penicillins    Review of Systems   Review of Systems  Constitutional:  Negative for fever.  Respiratory:  Negative for shortness of breath.   Gastrointestinal:  Positive for constipation and nausea. Negative for abdominal pain.  Genitourinary:  Negative for dysuria and hematuria.   Musculoskeletal:  Positive for back pain.  Neurological:  Negative for weakness and numbness.    Physical Exam Updated Vital Signs BP 132/87   Pulse 82   Temp 98.4 F (36.9 C) (Oral)   Resp 18   Ht '5\' 7"'$  (1.702 m)   Wt 81.6 kg   SpO2 99%   BMI 28.19 kg/m  Physical Exam Vitals and nursing note reviewed.  Constitutional:      General: He is not in acute distress.    Appearance: He is well-developed. He is not ill-appearing or diaphoretic.  HENT:     Head: Normocephalic and atraumatic.  Cardiovascular:     Rate and Rhythm: Normal rate and regular rhythm.     Heart sounds: Normal heart sounds.  Pulmonary:     Effort: Pulmonary effort is normal.     Breath sounds: Normal breath sounds.  Abdominal:     General: There is no distension.     Palpations: Abdomen is soft.     Tenderness: There is no abdominal tenderness.  Musculoskeletal:       Back:  Skin:    General: Skin is warm and dry.  Neurological:     Mental Status: He is alert.     Deep Tendon Reflexes:     Reflex Scores:      Patellar reflexes are 2+ on the right side and 2+ on the left side.    Comments: 5/5 strength in both lower extremities. Grossly normal sensation     ED Results / Procedures / Treatments   Labs (all labs ordered are listed, but only abnormal results are displayed) Labs Reviewed  COMPREHENSIVE METABOLIC PANEL - Abnormal; Notable for the following components:      Result Value   Sodium 131 (*)    Glucose, Bld 277 (*)    All other components within normal limits  URINALYSIS, ROUTINE W REFLEX MICROSCOPIC - Abnormal; Notable for the following components:   Glucose, UA >=500 (*)    Ketones, ur 5 (*)    All other components within normal limits  CBC WITH DIFFERENTIAL/PLATELET    EKG None  Radiology CT Renal Stone Study  Result Date: 09/10/2022 CLINICAL DATA:  Abdominal pain EXAM: CT ABDOMEN AND PELVIS WITHOUT CONTRAST TECHNIQUE: Multidetector CT imaging of the abdomen and pelvis was  performed following the standard protocol without IV contrast. RADIATION DOSE REDUCTION: This exam was performed according to the departmental dose-optimization program which includes automated exposure control, adjustment of the mA and/or kV according to patient size and/or use of iterative reconstruction technique. COMPARISON:  None Available. FINDINGS: Lower chest: No acute abnormality. Hepatobiliary: Liver demonstrates decreased attenuation consistent with fatty infiltration without focal lesions. No biliary ductal dilatation identified. Status post cholecystectomy. Pancreas: Unremarkable. No pancreatic ductal dilatation or surrounding inflammatory changes. Spleen: Normal in size without focal abnormality. Adrenals/Urinary Tract: Adrenal  glands are unremarkable. Left-sided extrarenal pelvis. Kidneys are normal, without renal calculi, focal lesion, or hydronephrosis. Bladder is unremarkable. Stomach/Bowel: Stomach is within normal limits. Appendix appears normal. No evidence of bowel wall thickening, distention, or inflammatory changes. Vascular/Lymphatic: No significant vascular findings are present. No enlarged abdominal or pelvic lymph nodes. Reproductive: Prostate is prominent. Other: No abdominal wall hernia or abnormality. No abdominopelvic ascites. Musculoskeletal: No acute or significant osseous findings. IMPRESSION: 1. Hepatic hypodensity consistent with fatty infiltration. 2. Prominent prostate. 3. Left kidney extrarenal pelvis. 4. No nephrolithiasis or hydronephrosis identified. Electronically Signed   By: Sammie Bench M.D.   On: 09/10/2022 13:20    Procedures Procedures    Medications Ordered in ED Medications  ondansetron (ZOFRAN-ODT) disintegrating tablet 4 mg (4 mg Oral Given 09/10/22 1243)  oxyCODONE-acetaminophen (PERCOCET/ROXICET) 5-325 MG per tablet 1 tablet (1 tablet Oral Given 09/10/22 1243)    ED Course/ Medical Decision Making/ A&P                             Medical  Decision Making Amount and/or Complexity of Data Reviewed Labs:     Details: Hyperglycemia but no acidosis.  No hematuria or UTI.  Normal WBC. Radiology: independent interpretation performed.    Details: No ureteral stone/hydronephrosis.  Risk OTC drugs. Prescription drug management.   Patient presents with what is probably muscular back pain.  Certain movements seem to exacerbate it.  CT is overall unremarkable.  I do not think a PE, AAA, aortic dissection, ACS, or other emergent pathology is likely present.  Will advise NSAIDs and will add on a low-dose Robaxin.  He has no red flags concerning for spinal cord emergency.  Will advise on some exercises and have him follow-up with PCP.  He is hyperglycemic but no acidosis.  No radicular pain to suggest that he needs steroids at this time or an emergent MRI.  Will discharge home with return precautions.        Final Clinical Impression(s) / ED Diagnoses Final diagnoses:  Acute right-sided thoracic back pain    Rx / DC Orders ED Discharge Orders          Ordered    docusate sodium (COLACE) 100 MG capsule  Every 12 hours        09/10/22 1522    methocarbamol (ROBAXIN) 500 MG tablet  2 times daily PRN        09/10/22 1522              Sherwood Gambler, MD 09/10/22 1542

## 2024-08-03 ENCOUNTER — Ambulatory Visit: Admitting: Podiatry

## 2024-08-06 ENCOUNTER — Ambulatory Visit (INDEPENDENT_AMBULATORY_CARE_PROVIDER_SITE_OTHER): Admitting: Podiatry

## 2024-08-06 ENCOUNTER — Encounter: Payer: Self-pay | Admitting: Podiatry

## 2024-08-06 DIAGNOSIS — L600 Ingrowing nail: Secondary | ICD-10-CM | POA: Diagnosis not present

## 2024-08-06 DIAGNOSIS — B351 Tinea unguium: Secondary | ICD-10-CM

## 2024-08-06 NOTE — Progress Notes (Signed)
 Patient presents for evaluation and treatment of tenderness and some redness around nails feet.  Tenderness around toes with walking and wearing shoes.  Physical exam:  General appearance: Alert, pleasant, and in no acute distress.  Vascular: Pedal pulses: DP 2/4 B/L, PT 2/4 B/L.  Mild edema lower legs bilaterally.  Capillary refill time immediate bilaterally  Neurologic: Light touch intact feet bilaterally.  Decreased vibratory sensation in feet bilaterally.  Monofilament station intact toes feet bilaterally normal Achilles tendon reflex bilaterally.  Negative Tinel's sign tarsal tunnel and porta pedis and peripheral cutaneous nerves.  Dermatologic:  Nails thickened, disfigured, discolored 1-5 BL with subungual debris.  Redness and hypertrophic nail folds along nail folds bilaterally but no signs of drainage or infection.  Hallux nails are very incurvated with some hypertrophy of the nail fold and some redness.  Musculoskeletal:  Mild hammertoes 2 through 5 bilaterally   Diagnosis: 1. Painful onychomycotic nails 1 through 5 bilaterally. 2. Pain toes 1 through 5 bilaterally. 2.  Ingrown nails hallux bilaterally  Plan: -New patient office visit for evaluation and management level 3.  Modifier 25. - Discussed diabetes and precautions he needs to take as a diabetic do not go barefoot.  Wear good stable shoes.  Will try to get him set up with diabetic shoes and insoles.  Told him things to look out for that could be warning signs of a problem on the feet. -Order for diabetic shoes and custom insoles, dispense 1 pair  -Debrided onychomycotic nails 1 through 5 bilaterally.  Sharply debrided nails with nail clipper and reduced with a power bur.  Return 3 months [facility]

## 2024-11-04 ENCOUNTER — Ambulatory Visit: Admitting: Podiatry
